# Patient Record
Sex: Male | Born: 1965 | Race: White | Hispanic: No | Marital: Married | Smoking: Current every day smoker
Health system: Southern US, Community
[De-identification: ages and names within clinical notes are randomized; demographics above are authoritative.]

## PROBLEM LIST (undated history)

## (undated) DIAGNOSIS — F329 Major depressive disorder, single episode, unspecified: Secondary | ICD-10-CM

## (undated) DIAGNOSIS — S32409A Unspecified fracture of unspecified acetabulum, initial encounter for closed fracture: Secondary | ICD-10-CM

## (undated) DIAGNOSIS — M1990 Unspecified osteoarthritis, unspecified site: Secondary | ICD-10-CM

## (undated) DIAGNOSIS — F32A Depression, unspecified: Secondary | ICD-10-CM

## (undated) DIAGNOSIS — K219 Gastro-esophageal reflux disease without esophagitis: Secondary | ICD-10-CM

## (undated) HISTORY — PX: JOINT REPLACEMENT: SHX530

## (undated) HISTORY — PX: REVISION OF TOTAL HIP ACETABULUM: SUR1275

---
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
## 2002-02-18 ENCOUNTER — Encounter: Payer: Self-pay | Admitting: *Deleted

## 2002-02-18 ENCOUNTER — Encounter: Admission: RE | Admit: 2002-02-18 | Discharge: 2002-02-18 | Payer: Self-pay | Admitting: *Deleted

## 2005-04-08 ENCOUNTER — Encounter: Admission: RE | Admit: 2005-04-08 | Discharge: 2005-04-08 | Payer: Self-pay | Admitting: Family Medicine

## 2006-09-15 ENCOUNTER — Encounter: Admission: RE | Admit: 2006-09-15 | Discharge: 2006-09-15 | Payer: Self-pay | Admitting: Internal Medicine

## 2009-09-09 ENCOUNTER — Inpatient Hospital Stay (HOSPITAL_COMMUNITY): Admission: RE | Admit: 2009-09-09 | Discharge: 2009-09-12 | Payer: Self-pay | Admitting: Orthopedic Surgery

## 2009-12-15 ENCOUNTER — Emergency Department (HOSPITAL_COMMUNITY): Admission: EM | Admit: 2009-12-15 | Discharge: 2009-12-16 | Payer: Self-pay | Admitting: Emergency Medicine

## 2010-03-21 ENCOUNTER — Encounter: Payer: Self-pay | Admitting: *Deleted

## 2010-05-15 LAB — CBC
HCT: 28.8 % — ABNORMAL LOW (ref 39.0–52.0)
HCT: 30.7 % — ABNORMAL LOW (ref 39.0–52.0)
Hemoglobin: 10.1 g/dL — ABNORMAL LOW (ref 13.0–17.0)
MCH: 31.1 pg (ref 26.0–34.0)
MCH: 31.3 pg (ref 26.0–34.0)
MCV: 88.6 fL (ref 78.0–100.0)
MCV: 88.9 fL (ref 78.0–100.0)
Platelets: 146 10*3/uL — ABNORMAL LOW (ref 150–400)
RBC: 3.24 MIL/uL — ABNORMAL LOW (ref 4.22–5.81)
RBC: 3.47 MIL/uL — ABNORMAL LOW (ref 4.22–5.81)

## 2010-05-15 LAB — BASIC METABOLIC PANEL
CO2: 32 mEq/L (ref 19–32)
Chloride: 99 mEq/L (ref 96–112)
Creatinine, Ser: 1.01 mg/dL (ref 0.4–1.5)
GFR calc Af Amer: >60 mL/min (ref >60)
Potassium: 5 mEq/L (ref 3.5–5.1)

## 2010-05-16 LAB — URINALYSIS, ROUTINE W REFLEX MICROSCOPIC
Hgb urine dipstick: NEGATIVE
Nitrite: NEGATIVE
Specific Gravity, Urine: 1.016 (ref 1.005–1.030)
Urobilinogen, UA: 0.2 mg/dL (ref 0.0–1.0)

## 2010-05-16 LAB — COMPREHENSIVE METABOLIC PANEL
AST: 28 U/L (ref 0–37)
Albumin: 4 g/dL (ref 3.5–5.2)
Alkaline Phosphatase: 38 U/L — ABNORMAL LOW (ref 39–117)
BUN: 10 mg/dL (ref 6–23)
GFR calc Af Amer: >60 mL/min (ref >60)
Potassium: 4.4 mEq/L (ref 3.5–5.1)
Total Protein: 6.7 g/dL (ref 6.0–8.3)

## 2010-05-16 LAB — CBC
HCT: 34.4 % — ABNORMAL LOW (ref 39.0–52.0)
MCH: 31.1 pg (ref 26.0–34.0)
MCHC: 34.7 g/dL (ref 30.0–36.0)
MCV: 89.4 fL (ref 78.0–100.0)
MCV: 89.6 fL (ref 78.0–100.0)
Platelets: 180 10*3/uL (ref 150–400)
RDW: 13.5 % (ref 11.5–15.5)
RDW: 13.5 % (ref 11.5–15.5)
WBC: 6.6 10*3/uL (ref 4.0–10.5)

## 2010-05-16 LAB — PROTIME-INR: INR: 0.99 (ref 0.00–1.49)

## 2010-05-16 LAB — BASIC METABOLIC PANEL
BUN: 8 mg/dL (ref 6–23)
Chloride: 95 mEq/L — ABNORMAL LOW (ref 96–112)
Glucose, Bld: 132 mg/dL — ABNORMAL HIGH (ref 70–99)
Potassium: 4 mEq/L (ref 3.5–5.1)

## 2010-05-16 LAB — SURGICAL PCR SCREEN
MRSA, PCR: NEGATIVE
Staphylococcus aureus: NEGATIVE

## 2010-05-16 LAB — APTT: aPTT: 36 seconds (ref 24–37)

## 2011-09-06 IMAGING — CR DG PORTABLE PELVIS
1 series · 1 of 1 positions shown · non-contrast
Comparison: 08/28/2009

CLINICAL DATA: Left hip arthroplasty

PORTABLE PELVIS

[series 1]
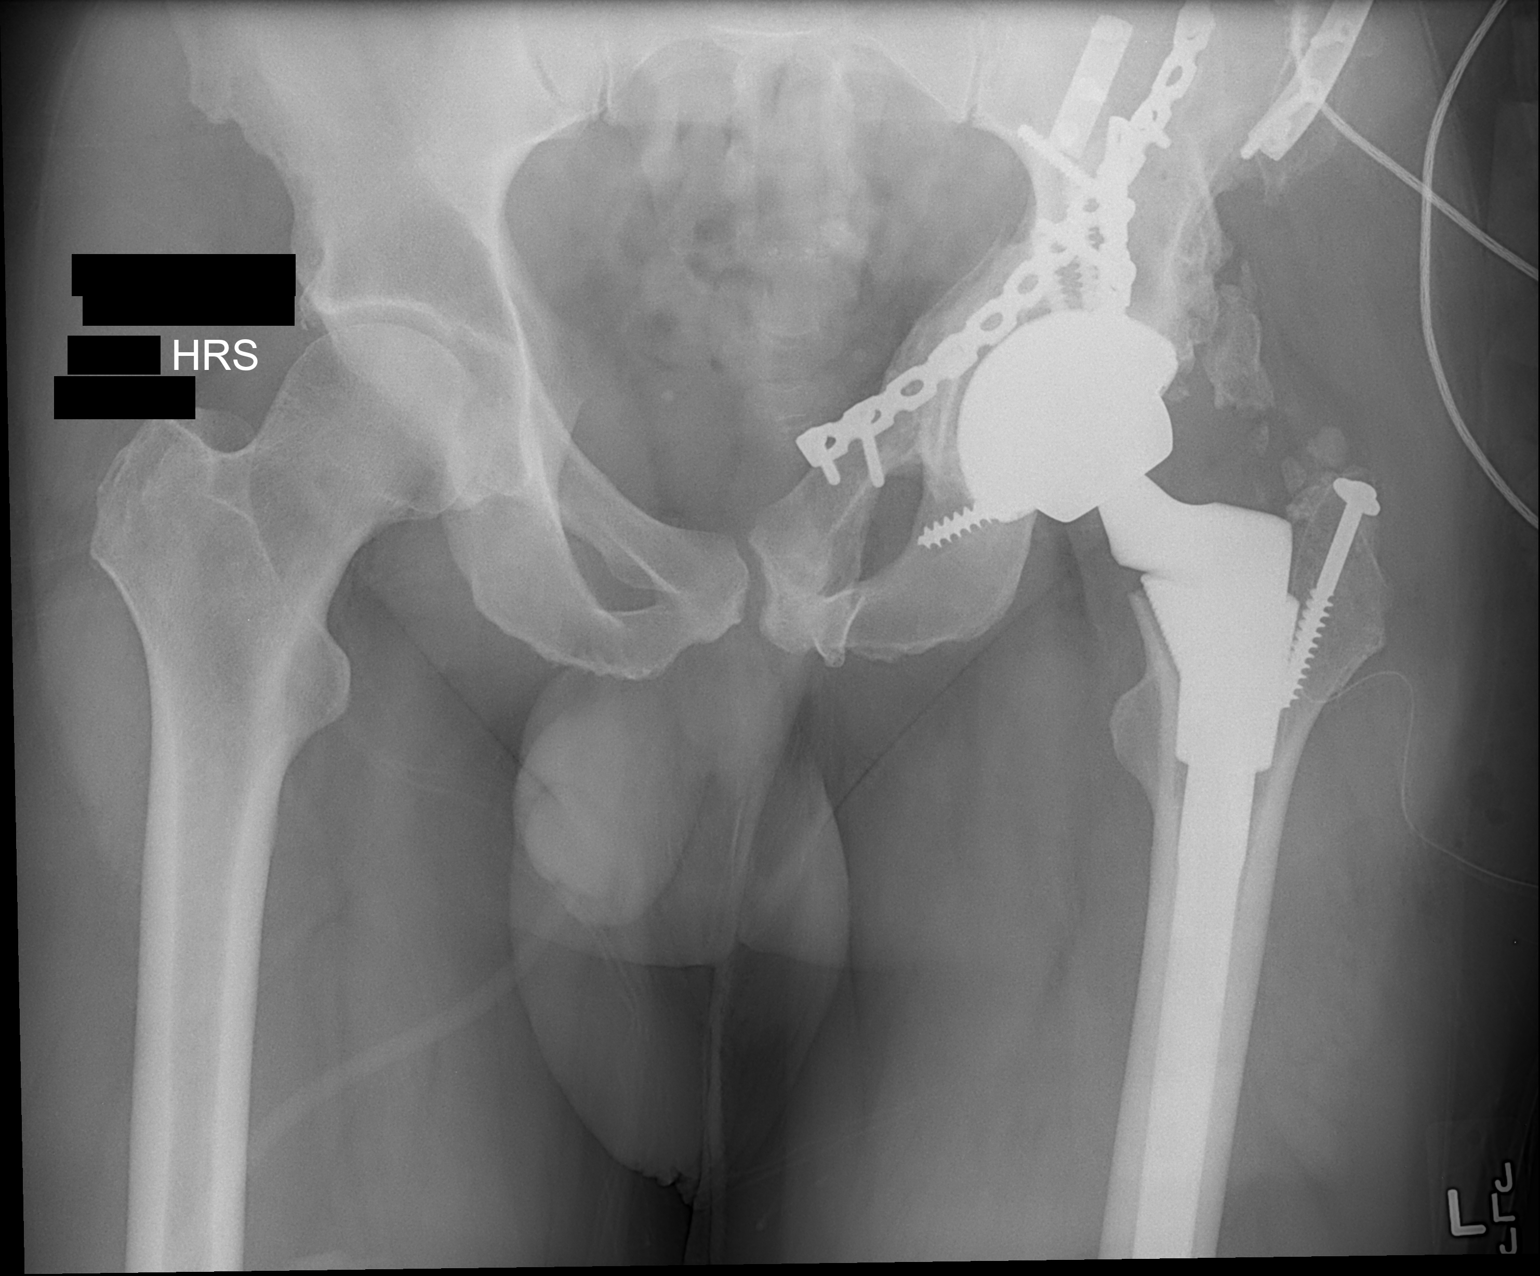

[1 of 1 positions shown; findings below may reference images not displayed]

FINDINGS: Femoral and acetabular components of left hip
arthroplasty project in expected location.  The tip of the femoral
stem is not included on this pelvic radiograph.  Changes of
previous plate and screw fixation of left iliac and    ischial
fractures are stable.  Corticated ossicles project lateral to the
hip as before.
IMPRESSION: 1.  Left hip arthroplasty without apparent complication.

## 2011-09-06 IMAGING — CR DG HIP 1V PORT*L*
1 series · 1 of 1 positions shown · non-contrast
Comparison: 08/28/2009

CLINICAL DATA: PORTABLE LEFT HIP - 1 VIEW

[series 1]
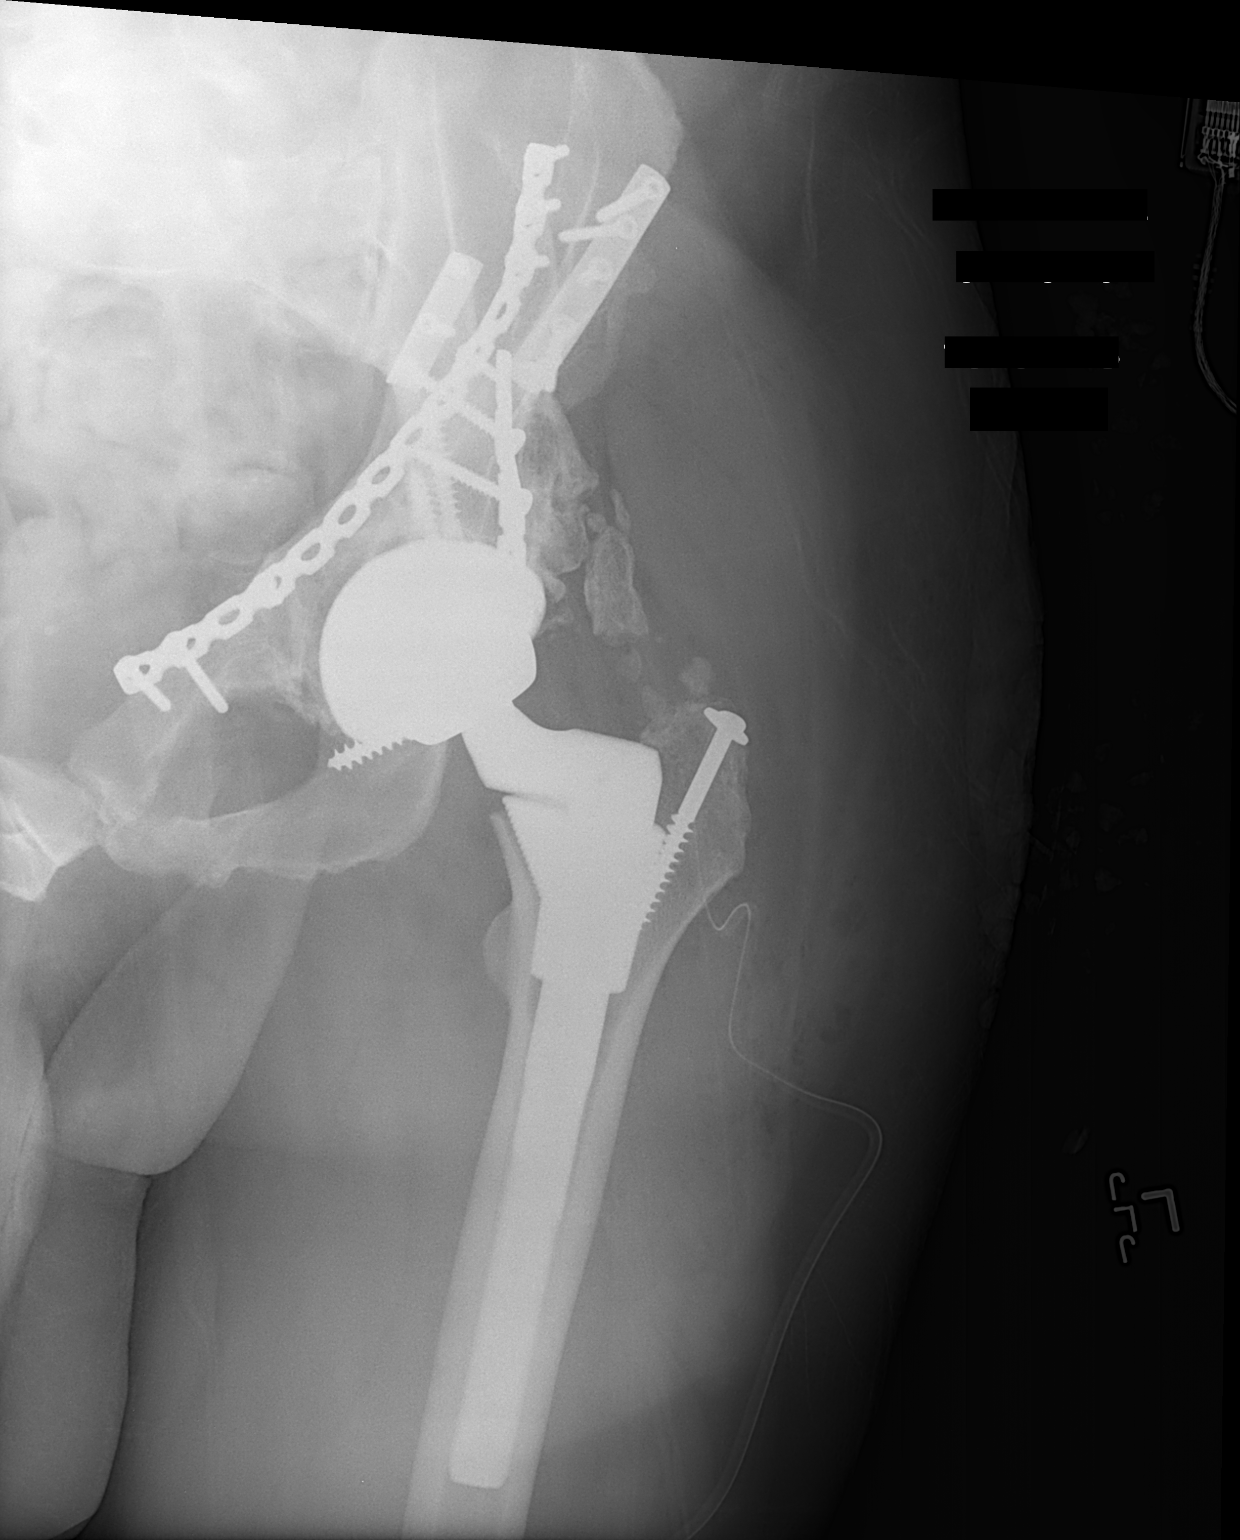

[1 of 1 positions shown; findings below may reference images not displayed]

FINDINGS: Femoral and acetabular components of left hip
arthroplasty project in expected location.   Changes of
previous plate and screw fixation of left iliac and ischial
fractures are stable. Corticated ossicles project lateral to the
hip as before. There is a lateral surgical drain.
IMPRESSION: 1. Left hip arthroplasty without apparent complication.

## 2011-12-18 ENCOUNTER — Emergency Department (INDEPENDENT_AMBULATORY_CARE_PROVIDER_SITE_OTHER)
Admission: EM | Admit: 2011-12-18 | Discharge: 2011-12-18 | Disposition: A | Discharge status: Home / Self Care | Payer: Managed Care, Other (non HMO) | Source: Home / Self Care | Attending: Emergency Medicine | Admitting: Emergency Medicine

## 2011-12-18 ENCOUNTER — Encounter (HOSPITAL_COMMUNITY): Payer: Self-pay | Admitting: Emergency Medicine

## 2011-12-18 DIAGNOSIS — IMO0002 Reserved for concepts with insufficient information to code with codable children: Secondary | ICD-10-CM

## 2011-12-18 DIAGNOSIS — M5416 Radiculopathy, lumbar region: Secondary | ICD-10-CM

## 2011-12-18 HISTORY — DX: Unspecified fracture of unspecified acetabulum, initial encounter for closed fracture: S32.409A

## 2011-12-18 MED ORDER — ONDANSETRON HCL 4 MG/2ML IJ SOLN: 4.0000 mg | Freq: Once | INTRAMUSCULAR | Status: DC

## 2011-12-18 MED ORDER — ONDANSETRON 4 MG PO TBDP
ORAL_TABLET | ORAL | Status: AC
  Filled 2011-12-18: qty 1

## 2011-12-18 MED ORDER — CARISOPRODOL 350 MG PO TABS: 350.0000 mg | ORAL_TABLET | Freq: Three times a day (TID) | ORAL | Status: DC

## 2011-12-18 MED ORDER — METHYLPREDNISOLONE ACETATE 80 MG/ML IJ SUSP
80.0000 mg | Freq: Once | INTRAMUSCULAR | Status: AC
  Administered 2011-12-18: 80 mg via INTRAMUSCULAR

## 2011-12-18 MED ORDER — PREDNISONE 10 MG PO TABS: ORAL_TABLET | ORAL | Status: DC

## 2011-12-18 MED ORDER — ONDANSETRON HCL 4 MG/2ML IJ SOLN
INTRAMUSCULAR | Status: AC
  Filled 2011-12-18: qty 2

## 2011-12-18 MED ORDER — HYDROMORPHONE HCL PF 1 MG/ML IJ SOLN
INTRAMUSCULAR | Status: AC
  Filled 2011-12-18: qty 2

## 2011-12-18 MED ORDER — ONDANSETRON 4 MG PO TBDP
4.0000 mg | ORAL_TABLET | Freq: Once | ORAL | Status: AC
  Administered 2011-12-18: 4 mg via ORAL

## 2011-12-18 MED ORDER — METHYLPREDNISOLONE ACETATE 80 MG/ML IJ SUSP
INTRAMUSCULAR | Status: AC
  Filled 2011-12-18: qty 1

## 2011-12-18 MED ORDER — HYDROMORPHONE HCL PF 1 MG/ML IJ SOLN
2.0000 mg | Freq: Once | INTRAMUSCULAR | Status: AC
  Administered 2011-12-18: 2 mg via INTRAMUSCULAR

## 2011-12-18 MED ORDER — OXYCODONE-ACETAMINOPHEN 5-325 MG PO TABS: ORAL_TABLET | ORAL | Status: DC

## 2011-12-18 NOTE — ED Notes (Signed)
Pain onset one week ago, pain has increased since then.  Patient has had chronic back pain since an injury as a teenager.  Patient does work out frequently. Does not remember injuring self recently.

## 2011-12-18 NOTE — ED Provider Notes (Signed)
Chief Complaint  Patient presents with  . Back Pain    History of Present Illness:   Shawn Romero is a 46 year old male who has had a 2 to three-day history of severe, right lower back pain with radiation down his right leg to the midcalf area. The patient's history dates back to his teenage years when he fractured his acetabulum and had an operative repair this followed by a total hip replacement in recent years. This was on the left side. For the past month he's had some pain in his lower back area and he went first to his orthopedist, Dr. Despina Hick who did a lumbar spine series which showed degenerative disc disease and then to his primary care physician, Dr. Earl Gala. The pain was stable up until about a week ago when he was doing some back exercises and it seemed to get a little bit worse. Over the past 2 or 3 days the pain has been excruciatingly severe, rated 10 over 10 in intensity. It's worse in almost any position and only position of comfort that he can find is lying flat on the floor his back with his legs up on the couch. He denies any numbness, tingling, or weakness in either lower extremity. He has had no bladder or bowel symptoms and no saddle anesthesia he denies any abdominal pain, fever, chills, or unintended weight loss. He can feel a pop in his back when he bends. He had a plain x-ray of his lumbar spine less than a week ago.  Review of Systems:  Other than noted above, the patient denies any of the following symptoms: Systemic:  No fever, chills, severe fatigue, or unexplained weight loss. GI:  No abdominal pain, nausea, vomiting, diarrhea, constipation, incontinence of bowel, or blood in stool. GU:  No dysuria, frequency, urgency, or hematuria. No incontinence of urine or difficulty urinating.  M-S:  No neck pain, joint pain, arthritis, or myalgias. Neuro:  No paresthesias, saddle anesthesia, muscular weakness, or progressive neurological deficit.  PMFSH:  Past medical history, family  history, social history, meds, and allergies were reviewed. Specifically, there is no history of cancer, major trauma, osteoporosis, immunosuppression, HIV, or IV or injection drug use.   Physical Exam:   Vital signs:  BP 140/79  Pulse 77  Temp 97.9 F (36.6 C) (Oral)  Resp 18  SpO2 98% General:  Alert, oriented, in significant distress due to lower back pain. Abdomen:  Soft, non-tender.  No organomegaly or mass.  No pulsatile midline abdominal mass or bruit. Back:  There is pain to palpation in his right lower back just above his iliac crest extending on down to the sacroiliac area and the buttock. The back had almost 0 range of motion with severe pain and spasm in any direction. Straight leg raising was negative bilaterally. I am able to appreciate a popping sensation in his back when he bends. Neuro:  Normal muscle strength, sensations and DTRs were unobtainable for both knees and ankles bilaterally. Extremities: Pedal pulses were full, there was no edema. Skin:  Clear, warm and dry.  No rash.  Course in Urgent Care Center:   He was given Dilaudid 2 mg IM, Zofran 8 mg sublingually, and Depo-Medrol 80 mg IM and tolerated these all well without any immediate side effects.  Assessment:  The encounter diagnosis was Lumbar radiculopathy.  Plan:   1.  The following meds were prescribed:   New Prescriptions   CARISOPRODOL (SOMA) 350 MG TABLET    Take 1 tablet (350  mg total) by mouth 3 (three) times daily.   OXYCODONE-ACETAMINOPHEN (PERCOCET) 5-325 MG PER TABLET    1 to 2 tablets every 6 hours as needed for pain.   PREDNISONE (DELTASONE) 10 MG TABLET    Take 4 tabs daily for 4 days, 3 tabs daily for 4 days, 2 tabs daily for 4 days, then 1 tab daily for 4 days.   2.  The patient was instructed in symptomatic care and handouts were given. I suggested that he followup with either his orthopedic surgeon or with a neurosurgeon he was given the name of Dr. Barnett Abu. 3.  The patient was told to  return if becoming worse in any way, if no better in 2 weeks, and given some red flag symptoms that would indicate earlier return. 4.  The patient was encouraged to try to be as active as possible and given some exercises to do followed by moist heat.    Reuben Likes, MD 12/18/11 [phone]

## 2012-03-16 ENCOUNTER — Other Ambulatory Visit: Payer: Self-pay | Admitting: Neurosurgery

## 2012-03-19 ENCOUNTER — Encounter (HOSPITAL_COMMUNITY): Payer: Self-pay | Admitting: Pharmacy Technician

## 2012-03-23 NOTE — Pre-Procedure Instructions (Addendum)
Jaran Sainz  03/23/2012   Your procedure is scheduled on:  03/28/12  Report to [facility] at830 AM.  Call this number if you have problems the morning of surgery: [phone]   Remember:   Do not eat food or drink liquids after midnight.   Take these medicines the morning of surgery with A SIP OF WATER:prilosec, lamictal, pain med, viibryd, soma  STOP any aspirin , nsaids, herbal meds ,blood thinners   Do not wear jewelry, make-up or nail polish.  Do not wear lotions, powders, or perfumes. You may wear deodorant.  Do not shave 48 hours prior to surgery. Men may shave face and neck.  Do not bring valuables to the hospital.  Contacts, dentures or bridgework may not be worn into surgery.  Leave suitcase in the car. After surgery it may be brought to your room.  For patients admitted to the hospital, checkout time is 11:00 AM the day of  discharge.   Patients discharged the day of surgery will not be allowed to drive  home.  Name and phone number of your driver: laura [phone]  Special Instructions: Shower using CHG 2 nights before surgery and the night before surgery.  If you shower the day of surgery use CHG.  Use special wash - you have one bottle of CHG for all showers.  You should use approximately 1/3 of the bottle for each shower.   Please read over the following fact sheets that you were given: Pain Booklet, Coughing and Deep Breathing, Blood Transfusion Information, MRSA Information and Surgical Site Infection Prevention

## 2012-03-26 ENCOUNTER — Encounter (HOSPITAL_COMMUNITY)
Admission: RE | Admit: 2012-03-26 | Discharge: 2012-03-26 | Disposition: A | Discharge status: Home / Self Care | Payer: Managed Care, Other (non HMO) | Source: Ambulatory Visit | Attending: Neurosurgery | Admitting: Neurosurgery

## 2012-03-26 ENCOUNTER — Encounter (HOSPITAL_COMMUNITY): Payer: Self-pay

## 2012-03-26 HISTORY — DX: Major depressive disorder, single episode, unspecified: F32.9

## 2012-03-26 HISTORY — DX: Depression, unspecified: F32.A

## 2012-03-26 HISTORY — DX: Gastro-esophageal reflux disease without esophagitis: K21.9

## 2012-03-26 HISTORY — DX: Unspecified osteoarthritis, unspecified site: M19.90

## 2012-03-26 LAB — CBC
Hemoglobin: 15.9 g/dL (ref 13.0–17.0)
MCH: 30.5 pg (ref 26.0–34.0)
MCV: 84.5 fL (ref 78.0–100.0)
RBC: 5.21 MIL/uL (ref 4.22–5.81)

## 2012-03-26 LAB — BASIC METABOLIC PANEL
BUN: 14 mg/dL (ref 6–23)
CO2: 24 mEq/L (ref 19–32)
Chloride: 96 mEq/L (ref 96–112)
Creatinine, Ser: 0.89 mg/dL (ref 0.50–1.35)
Glucose, Bld: 109 mg/dL — ABNORMAL HIGH (ref 70–99)

## 2012-03-27 MED ORDER — CEFAZOLIN SODIUM-DEXTROSE 2-3 GM-% IV SOLR: 2.0000 g | INTRAVENOUS | Status: DC

## 2012-03-28 ENCOUNTER — Encounter (HOSPITAL_COMMUNITY): Admission: RE | Payer: Self-pay | Source: Ambulatory Visit

## 2012-03-28 ENCOUNTER — Ambulatory Visit (HOSPITAL_COMMUNITY)
Admission: RE | Admit: 2012-03-28 | Payer: Managed Care, Other (non HMO) | Source: Ambulatory Visit | Admitting: Neurosurgery

## 2012-03-28 SURGERY — POSTERIOR LUMBAR FUSION 1 LEVEL
Anesthesia: General | Site: Back

## 2012-03-29 ENCOUNTER — Other Ambulatory Visit: Payer: Self-pay | Admitting: Neurosurgery

## 2012-03-29 ENCOUNTER — Encounter (HOSPITAL_COMMUNITY): Payer: Self-pay | Admitting: Pharmacy Technician

## 2012-04-01 MED ORDER — CEFAZOLIN SODIUM-DEXTROSE 2-3 GM-% IV SOLR
2.0000 g | INTRAVENOUS | Status: AC
  Administered 2012-04-02 (×2): 2 g via INTRAVENOUS
  Filled 2012-04-01: qty 50

## 2012-04-02 ENCOUNTER — Ambulatory Visit (HOSPITAL_COMMUNITY): Payer: Managed Care, Other (non HMO)

## 2012-04-02 ENCOUNTER — Inpatient Hospital Stay (HOSPITAL_COMMUNITY)
Admission: RE | Admit: 2012-04-02 | Discharge: 2012-04-05 | DRG: 460 | Disposition: A | Discharge status: Home / Self Care | Payer: Managed Care, Other (non HMO) | Source: Ambulatory Visit | Attending: Neurosurgery | Admitting: Neurosurgery

## 2012-04-02 ENCOUNTER — Ambulatory Visit (HOSPITAL_COMMUNITY): Payer: Managed Care, Other (non HMO) | Admitting: Anesthesiology

## 2012-04-02 ENCOUNTER — Encounter (HOSPITAL_COMMUNITY): Payer: Self-pay | Admitting: Anesthesiology

## 2012-04-02 ENCOUNTER — Encounter (HOSPITAL_COMMUNITY): Payer: Self-pay | Admitting: *Deleted

## 2012-04-02 ENCOUNTER — Encounter (HOSPITAL_COMMUNITY)
Admission: RE | Disposition: A | Discharge status: Home / Self Care | Payer: Self-pay | Source: Ambulatory Visit | Attending: Neurosurgery

## 2012-04-02 DIAGNOSIS — Z96649 Presence of unspecified artificial hip joint: Secondary | ICD-10-CM

## 2012-04-02 DIAGNOSIS — F319 Bipolar disorder, unspecified: Secondary | ICD-10-CM | POA: Diagnosis present

## 2012-04-02 DIAGNOSIS — M129 Arthropathy, unspecified: Secondary | ICD-10-CM | POA: Diagnosis present

## 2012-04-02 DIAGNOSIS — Q762 Congenital spondylolisthesis: Principal | ICD-10-CM

## 2012-04-02 DIAGNOSIS — M4316 Spondylolisthesis, lumbar region: Secondary | ICD-10-CM

## 2012-04-02 DIAGNOSIS — K219 Gastro-esophageal reflux disease without esophagitis: Secondary | ICD-10-CM | POA: Diagnosis present

## 2012-04-02 DIAGNOSIS — M48062 Spinal stenosis, lumbar region with neurogenic claudication: Secondary | ICD-10-CM | POA: Diagnosis present

## 2012-04-02 DIAGNOSIS — F411 Generalized anxiety disorder: Secondary | ICD-10-CM | POA: Diagnosis present

## 2012-04-02 DIAGNOSIS — Z79899 Other long term (current) drug therapy: Secondary | ICD-10-CM

## 2012-04-02 DIAGNOSIS — Z01812 Encounter for preprocedural laboratory examination: Secondary | ICD-10-CM

## 2012-04-02 DIAGNOSIS — F17200 Nicotine dependence, unspecified, uncomplicated: Secondary | ICD-10-CM | POA: Diagnosis present

## 2012-04-02 SURGERY — POSTERIOR LUMBAR FUSION 1 LEVEL
Anesthesia: General | Site: Back | Wound class: Clean

## 2012-04-02 MED ORDER — 0.9 % SODIUM CHLORIDE (POUR BTL) OPTIME
TOPICAL | Status: DC | PRN
  Administered 2012-04-02: 1000 mL

## 2012-04-02 MED ORDER — BACITRACIN 50000 UNITS IM SOLR
INTRAMUSCULAR | Status: AC
  Filled 2012-04-02: qty 1

## 2012-04-02 MED ORDER — CALCIUM CHLORIDE 10 % IV SOLN: INTRAVENOUS | Status: DC | PRN

## 2012-04-02 MED ORDER — FENTANYL CITRATE 0.05 MG/ML IJ SOLN
INTRAMUSCULAR | Status: DC | PRN
  Administered 2012-04-02 (×9): 50 ug via INTRAVENOUS
  Administered 2012-04-02: 250 ug via INTRAVENOUS
  Administered 2012-04-02: 50 ug via INTRAVENOUS

## 2012-04-02 MED ORDER — BUPIVACAINE LIPOSOME 1.3 % IJ SUSP
INTRAMUSCULAR | Status: DC | PRN
  Administered 2012-04-02: 20 mL

## 2012-04-02 MED ORDER — PANTOPRAZOLE SODIUM 40 MG PO TBEC
40.0000 mg | DELAYED_RELEASE_TABLET | Freq: Every day | ORAL | Status: DC
  Administered 2012-04-02 – 2012-04-05 (×4): 40 mg via ORAL
  Filled 2012-04-02 (×4): qty 1

## 2012-04-02 MED ORDER — NEOSTIGMINE METHYLSULFATE 1 MG/ML IJ SOLN
INTRAMUSCULAR | Status: DC | PRN
  Administered 2012-04-02: 4 mg via INTRAVENOUS

## 2012-04-02 MED ORDER — EPHEDRINE SULFATE 50 MG/ML IJ SOLN
INTRAMUSCULAR | Status: DC | PRN
  Administered 2012-04-02: 10 mg via INTRAVENOUS
  Administered 2012-04-02: 15 mg via INTRAVENOUS
  Administered 2012-04-02: 10 mg via INTRAVENOUS

## 2012-04-02 MED ORDER — DIPHENHYDRAMINE HCL 12.5 MG/5ML PO ELIX: 12.5000 mg | ORAL_SOLUTION | Freq: Four times a day (QID) | ORAL | Status: DC | PRN

## 2012-04-02 MED ORDER — GLYCOPYRROLATE 0.2 MG/ML IJ SOLN
INTRAMUSCULAR | Status: DC | PRN
  Administered 2012-04-02: 0.6 mg via INTRAVENOUS

## 2012-04-02 MED ORDER — TRAZODONE HCL 150 MG PO TABS
300.0000 mg | ORAL_TABLET | Freq: Every day | ORAL | Status: DC
  Administered 2012-04-02 – 2012-04-04 (×3): 300 mg via ORAL
  Filled 2012-04-02 (×4): qty 2

## 2012-04-02 MED ORDER — ACETAMINOPHEN 325 MG PO TABS
650.0000 mg | ORAL_TABLET | ORAL | Status: DC | PRN
  Administered 2012-04-04: 650 mg via ORAL
  Filled 2012-04-02: qty 2

## 2012-04-02 MED ORDER — CALCIUM CHLORIDE 10 % IV SOLN
INTRAVENOUS | Status: DC | PRN
  Administered 2012-04-02: 100 mg via INTRAVENOUS

## 2012-04-02 MED ORDER — VECURONIUM BROMIDE 10 MG IV SOLR
INTRAVENOUS | Status: DC | PRN
  Administered 2012-04-02 (×3): 1 mg via INTRAVENOUS
  Administered 2012-04-02: 2 mg via INTRAVENOUS
  Administered 2012-04-02: 1 mg via INTRAVENOUS
  Administered 2012-04-02: 2 mg via INTRAVENOUS

## 2012-04-02 MED ORDER — NALOXONE HCL 0.4 MG/ML IJ SOLN: 0.4000 mg | INTRAMUSCULAR | Status: DC | PRN

## 2012-04-02 MED ORDER — LIDOCAINE HCL (CARDIAC) 20 MG/ML IV SOLN
INTRAVENOUS | Status: DC | PRN
  Administered 2012-04-02: 100 mg via INTRAVENOUS

## 2012-04-02 MED ORDER — MENTHOL 3 MG MT LOZG: 1.0000 | LOZENGE | OROMUCOSAL | Status: DC | PRN

## 2012-04-02 MED ORDER — ONDANSETRON HCL 4 MG/2ML IJ SOLN: 4.0000 mg | Freq: Four times a day (QID) | INTRAMUSCULAR | Status: DC | PRN

## 2012-04-02 MED ORDER — LACTATED RINGERS IV SOLN
INTRAVENOUS | Status: DC
  Administered 2012-04-02 – 2012-04-03 (×2): via INTRAVENOUS

## 2012-04-02 MED ORDER — MORPHINE SULFATE (PF) 1 MG/ML IV SOLN
INTRAVENOUS | Status: AC
  Administered 2012-04-02: 16:00:00
  Filled 2012-04-02: qty 25

## 2012-04-02 MED ORDER — DOCUSATE SODIUM 100 MG PO CAPS
100.0000 mg | ORAL_CAPSULE | Freq: Two times a day (BID) | ORAL | Status: DC
  Administered 2012-04-02 – 2012-04-05 (×6): 100 mg via ORAL
  Filled 2012-04-02 (×6): qty 1

## 2012-04-02 MED ORDER — LAMOTRIGINE 100 MG PO TABS
100.0000 mg | ORAL_TABLET | Freq: Every day | ORAL | Status: DC
  Administered 2012-04-03 – 2012-04-05 (×3): 100 mg via ORAL
  Filled 2012-04-02 (×3): qty 1

## 2012-04-02 MED ORDER — SODIUM CHLORIDE 0.9 % IV SOLN
INTRAVENOUS | Status: AC
  Filled 2012-04-02: qty 500

## 2012-04-02 MED ORDER — CEFAZOLIN SODIUM-DEXTROSE 2-3 GM-% IV SOLR
INTRAVENOUS | Status: AC
  Filled 2012-04-02: qty 50

## 2012-04-02 MED ORDER — DIAZEPAM 5 MG PO TABS
ORAL_TABLET | ORAL | Status: AC
  Filled 2012-04-02: qty 1

## 2012-04-02 MED ORDER — HYDROMORPHONE HCL PF 1 MG/ML IJ SOLN
INTRAMUSCULAR | Status: AC
  Filled 2012-04-02: qty 1

## 2012-04-02 MED ORDER — LIDOCAINE HCL 4 % MT SOLN
OROMUCOSAL | Status: DC | PRN
  Administered 2012-04-02: 4 mL via TOPICAL

## 2012-04-02 MED ORDER — HYDROMORPHONE HCL PF 1 MG/ML IJ SOLN
0.2500 mg | INTRAMUSCULAR | Status: DC | PRN
  Administered 2012-04-02 (×4): 0.5 mg via INTRAVENOUS

## 2012-04-02 MED ORDER — HYDROMORPHONE HCL PF 1 MG/ML IJ SOLN: 0.2500 mg | INTRAMUSCULAR | Status: DC | PRN

## 2012-04-02 MED ORDER — BUPIVACAINE-EPINEPHRINE PF 0.5-1:200000 % IJ SOLN
INTRAMUSCULAR | Status: DC | PRN
  Administered 2012-04-02: 10 mL

## 2012-04-02 MED ORDER — ROCURONIUM BROMIDE 100 MG/10ML IV SOLN
INTRAVENOUS | Status: DC | PRN
  Administered 2012-04-02: 10 mg via INTRAVENOUS
  Administered 2012-04-02: 50 mg via INTRAVENOUS
  Administered 2012-04-02: 30 mg via INTRAVENOUS
  Administered 2012-04-02: 10 mg via INTRAVENOUS

## 2012-04-02 MED ORDER — SODIUM CHLORIDE 0.9 % IR SOLN
Status: DC | PRN
  Administered 2012-04-02: 11:00:00

## 2012-04-02 MED ORDER — ACETAMINOPHEN 650 MG RE SUPP: 650.0000 mg | RECTAL | Status: DC | PRN

## 2012-04-02 MED ORDER — DIAZEPAM 5 MG PO TABS
5.0000 mg | ORAL_TABLET | Freq: Four times a day (QID) | ORAL | Status: DC | PRN
  Administered 2012-04-02 – 2012-04-05 (×11): 5 mg via ORAL
  Filled 2012-04-02 (×10): qty 1

## 2012-04-02 MED ORDER — BACITRACIN ZINC 500 UNIT/GM EX OINT
TOPICAL_OINTMENT | CUTANEOUS | Status: DC | PRN
  Administered 2012-04-02: 1 via TOPICAL

## 2012-04-02 MED ORDER — HYDROCODONE-ACETAMINOPHEN 5-325 MG PO TABS
1.0000 | ORAL_TABLET | ORAL | Status: DC | PRN
  Administered 2012-04-03 – 2012-04-05 (×7): 2 via ORAL
  Filled 2012-04-02 (×7): qty 2

## 2012-04-02 MED ORDER — PROPOFOL 10 MG/ML IV BOLUS
INTRAVENOUS | Status: DC | PRN
  Administered 2012-04-02: 400 mg via INTRAVENOUS

## 2012-04-02 MED ORDER — ONDANSETRON HCL 4 MG/2ML IJ SOLN: 4.0000 mg | Freq: Once | INTRAMUSCULAR | Status: DC | PRN

## 2012-04-02 MED ORDER — PHENOL 1.4 % MT LIQD: 1.0000 | OROMUCOSAL | Status: DC | PRN

## 2012-04-02 MED ORDER — ZOLPIDEM TARTRATE 5 MG PO TABS
5.0000 mg | ORAL_TABLET | Freq: Every evening | ORAL | Status: DC | PRN
  Administered 2012-04-02 – 2012-04-04 (×3): 5 mg via ORAL
  Filled 2012-04-02 (×3): qty 1

## 2012-04-02 MED ORDER — CEFAZOLIN SODIUM-DEXTROSE 2-3 GM-% IV SOLR
2.0000 g | Freq: Three times a day (TID) | INTRAVENOUS | Status: AC
  Administered 2012-04-02 – 2012-04-03 (×2): 2 g via INTRAVENOUS
  Filled 2012-04-02 (×2): qty 50

## 2012-04-02 MED ORDER — MORPHINE SULFATE (PF) 1 MG/ML IV SOLN
INTRAVENOUS | Status: DC
  Administered 2012-04-02: 21:00:00 via INTRAVENOUS
  Filled 2012-04-02 (×2): qty 25

## 2012-04-02 MED ORDER — ONDANSETRON HCL 4 MG/2ML IJ SOLN: 4.0000 mg | INTRAMUSCULAR | Status: DC | PRN

## 2012-04-02 MED ORDER — MIDAZOLAM HCL 5 MG/5ML IJ SOLN
INTRAMUSCULAR | Status: DC | PRN
  Administered 2012-04-02: 2 mg via INTRAVENOUS

## 2012-04-02 MED ORDER — SODIUM CHLORIDE 0.9 % IJ SOLN: 9.0000 mL | INTRAMUSCULAR | Status: DC | PRN

## 2012-04-02 MED ORDER — ALBUMIN HUMAN 5 % IV SOLN
INTRAVENOUS | Status: DC | PRN
  Administered 2012-04-02: 14:00:00 via INTRAVENOUS

## 2012-04-02 MED ORDER — VILAZODONE HCL 20 MG PO TABS
1.0000 | ORAL_TABLET | Freq: Every day | ORAL | Status: DC
  Administered 2012-04-03 – 2012-04-05 (×3): 20 mg via ORAL
  Filled 2012-04-02 (×3): qty 1

## 2012-04-02 MED ORDER — HYDROMORPHONE HCL PF 1 MG/ML IJ SOLN
0.5000 mg | INTRAMUSCULAR | Status: DC | PRN
  Administered 2012-04-02 – 2012-04-03 (×5): 0.5 mg via INTRAVENOUS
  Filled 2012-04-02 (×5): qty 1

## 2012-04-02 MED ORDER — LACTATED RINGERS IV SOLN
INTRAVENOUS | Status: DC | PRN
  Administered 2012-04-02 (×4): via INTRAVENOUS

## 2012-04-02 MED ORDER — ONDANSETRON HCL 4 MG/2ML IJ SOLN
INTRAMUSCULAR | Status: DC | PRN
  Administered 2012-04-02: 4 mg via INTRAVENOUS

## 2012-04-02 MED ORDER — BUPIVACAINE LIPOSOME 1.3 % IJ SUSP
20.0000 mL | Freq: Once | INTRAMUSCULAR | Status: DC
  Filled 2012-04-02: qty 20

## 2012-04-02 MED ORDER — HYDROMORPHONE HCL 2 MG PO TABS
4.0000 mg | ORAL_TABLET | ORAL | Status: DC | PRN
  Administered 2012-04-02 – 2012-04-05 (×10): 4 mg via ORAL
  Filled 2012-04-02 (×10): qty 2

## 2012-04-02 MED ORDER — DIPHENHYDRAMINE HCL 50 MG/ML IJ SOLN: 12.5000 mg | Freq: Four times a day (QID) | INTRAMUSCULAR | Status: DC | PRN

## 2012-04-02 MED ORDER — THROMBIN 20000 UNITS EX SOLR
CUTANEOUS | Status: DC | PRN
  Administered 2012-04-02: 11:00:00 via TOPICAL

## 2012-04-02 MED ORDER — HYDROMORPHONE HCL PF 1 MG/ML IJ SOLN
0.2500 mg | INTRAMUSCULAR | Status: DC | PRN
  Administered 2012-04-02 (×5): 0.5 mg via INTRAVENOUS

## 2012-04-02 SURGICAL SUPPLY — 67 items
BAG DECANTER FOR FLEXI CONT (MISCELLANEOUS) ×2 IMPLANT
BENZOIN TINCTURE PRP APPL 2/3 (GAUZE/BANDAGES/DRESSINGS) ×2 IMPLANT
BLADE SURG 15 STRL LF DISP TIS (BLADE) ×1 IMPLANT
BLADE SURG 15 STRL SS (BLADE) ×2
BLADE SURG ROTATE 9660 (MISCELLANEOUS) ×2 IMPLANT
BRUSH SCRUB EZ PLAIN DRY (MISCELLANEOUS) ×2 IMPLANT
BUR ACORN 6.0 (BURR) ×2 IMPLANT
BUR MATCHSTICK NEURO 3.0 LAGG (BURR) ×4 IMPLANT
CANISTER SUCTION 2500CC (MISCELLANEOUS) ×2 IMPLANT
CAP REVERE LOCKING (Cap) ×8 IMPLANT
CLOTH BEACON ORANGE TIMEOUT ST (SAFETY) ×2 IMPLANT
CONT SPEC 4OZ CLIKSEAL STRL BL (MISCELLANEOUS) ×2 IMPLANT
COVER BACK TABLE 24X17X13 BIG (DRAPES) IMPLANT
COVER TABLE BACK 60X90 (DRAPES) ×2 IMPLANT
DRAPE C-ARM 42X72 X-RAY (DRAPES) ×8 IMPLANT
DRAPE LAPAROTOMY 100X72X124 (DRAPES) ×2 IMPLANT
DRAPE POUCH INSTRU U-SHP 10X18 (DRAPES) ×2 IMPLANT
DRAPE PROXIMA HALF (DRAPES) ×8 IMPLANT
DRAPE SURG 17X23 STRL (DRAPES) ×8 IMPLANT
ELECT BLADE 4.0 EZ CLEAN MEGAD (MISCELLANEOUS) ×2
ELECT REM PT RETURN 9FT ADLT (ELECTROSURGICAL) ×2
ELECTRODE BLDE 4.0 EZ CLN MEGD (MISCELLANEOUS) ×1 IMPLANT
ELECTRODE REM PT RTRN 9FT ADLT (ELECTROSURGICAL) ×1 IMPLANT
EVACUATOR 1/8 PVC DRAIN (DRAIN) ×2 IMPLANT
GAUZE SPONGE 4X4 16PLY XRAY LF (GAUZE/BANDAGES/DRESSINGS) IMPLANT
GLOVE BIO SURGEON STRL SZ8.5 (GLOVE) ×4 IMPLANT
GLOVE EXAM NITRILE LRG STRL (GLOVE) IMPLANT
GLOVE EXAM NITRILE MD LF STRL (GLOVE) IMPLANT
GLOVE EXAM NITRILE XL STR (GLOVE) IMPLANT
GLOVE EXAM NITRILE XS STR PU (GLOVE) IMPLANT
GLOVE INDICATOR 7.0 STRL GRN (GLOVE) ×6 IMPLANT
GLOVE SS BIOGEL STRL SZ 6.5 (GLOVE) ×2 IMPLANT
GLOVE SS BIOGEL STRL SZ 8 (GLOVE) ×2 IMPLANT
GLOVE SUPERSENSE BIOGEL SZ 6.5 (GLOVE) ×2
GLOVE SUPERSENSE BIOGEL SZ 8 (GLOVE) ×2
GOWN BRE IMP SLV AUR LG STRL (GOWN DISPOSABLE) ×4 IMPLANT
GOWN BRE IMP SLV AUR XL STRL (GOWN DISPOSABLE) ×4 IMPLANT
GOWN STRL REIN 2XL LVL4 (GOWN DISPOSABLE) IMPLANT
GRANULES ACTIFUSE 10ML (Putty) ×2 IMPLANT
KIT BASIN OR (CUSTOM PROCEDURE TRAY) ×2 IMPLANT
KIT ROOM TURNOVER OR (KITS) ×2 IMPLANT
NEEDLE HYPO 21X1.5 SAFETY (NEEDLE) ×2 IMPLANT
NEEDLE HYPO 22GX1.5 SAFETY (NEEDLE) ×2 IMPLANT
NS IRRIG 1000ML POUR BTL (IV SOLUTION) ×2 IMPLANT
PACK FOAM VITOSS 10CC (Orthopedic Implant) ×2 IMPLANT
PACK LAMINECTOMY NEURO (CUSTOM PROCEDURE TRAY) ×2 IMPLANT
PAD ARMBOARD 7.5X6 YLW CONV (MISCELLANEOUS) ×6 IMPLANT
PATTIES SURGICAL .5 X1 (DISPOSABLE) IMPLANT
ROD REVERE 6.35 40MM (Rod) ×4 IMPLANT
SCREW REVERE 6.35 75X55MM (Screw) ×8 IMPLANT
SLEEVE SURGEON STRL (DRAPES) ×2 IMPLANT
SPACER SUSTAIN O 10X26 13MM (Spacer) ×4 IMPLANT
SPONGE GAUZE 4X4 12PLY (GAUZE/BANDAGES/DRESSINGS) ×2 IMPLANT
SPONGE LAP 4X18 X RAY DECT (DISPOSABLE) IMPLANT
SPONGE NEURO XRAY DETECT 1X3 (DISPOSABLE) IMPLANT
SPONGE SURGIFOAM ABS GEL 100 (HEMOSTASIS) ×2 IMPLANT
STRIP CLOSURE SKIN 1/2X4 (GAUZE/BANDAGES/DRESSINGS) ×2 IMPLANT
SUT VIC AB 1 CT1 18XBRD ANBCTR (SUTURE) ×2 IMPLANT
SUT VIC AB 1 CT1 8-18 (SUTURE) ×2
SUT VIC AB 2-0 CP2 18 (SUTURE) ×4 IMPLANT
SYR 20CC LL (SYRINGE) ×2 IMPLANT
SYR 20ML ECCENTRIC (SYRINGE) ×4 IMPLANT
TAPE CLOTH SURG 4X10 WHT LF (GAUZE/BANDAGES/DRESSINGS) ×2 IMPLANT
TOWEL OR 17X24 6PK STRL BLUE (TOWEL DISPOSABLE) ×2 IMPLANT
TOWEL OR 17X26 10 PK STRL BLUE (TOWEL DISPOSABLE) ×2 IMPLANT
TRAY FOLEY CATH 14FRSI W/METER (CATHETERS) ×2 IMPLANT
WATER STERILE IRR 1000ML POUR (IV SOLUTION) ×2 IMPLANT

## 2012-04-02 NOTE — Anesthesia Preprocedure Evaluation (Addendum)
Anesthesia Evaluation  Patient identified by MRN, date of birth, ID band Patient awake    Reviewed: Allergy & Precautions, H&P , NPO status , Patient's Chart, lab work & pertinent test results  Airway Mallampati: II TM Distance: >3 FB Neck ROM: full    Dental  (+) Dental Advisory Given   Pulmonary Current Smoker,          Cardiovascular Rhythm:regular Rate:Normal     Neuro/Psych Anxiety Bipolar Disorder    GI/Hepatic GERD-  ,  Endo/Other    Renal/GU      Musculoskeletal  (+) Arthritis -,   Abdominal   Peds  Hematology   Anesthesia Other Findings   Reproductive/Obstetrics                          Anesthesia Physical Anesthesia Plan  ASA: II  Anesthesia Plan: General   Post-op Pain Management:    Induction: Intravenous  Airway Management Planned: Oral ETT  Additional Equipment:   Intra-op Plan:   Post-operative Plan: Extubation in OR  Informed Consent: I have reviewed the patients History and Physical, chart, labs and discussed the procedure including the risks, benefits and alternatives for the proposed anesthesia with the patient or authorized representative who has indicated his/her understanding and acceptance.   Dental advisory given  Plan Discussed with: CRNA, Anesthesiologist and Surgeon  Anesthesia Plan Comments:        Anesthesia Quick Evaluation

## 2012-04-02 NOTE — Progress Notes (Signed)
Patient received at 1735 from neuro PACU. Patient alert and oriented PCA morphine noted with lactated ringers infusing at 75cc/hour . o2 sat at 95% on 2l/min [place] . Back dressing noted with drainage shadowed to proximal area of dressing . Foley catheter intact . Oriented patient to room and call bell system . reviewed safety protocol with patient . Continue with plan of care .                               Shawn Romero

## 2012-04-02 NOTE — H&P (Signed)
Subjective: The patient is a 47 year old white male who has suffered from chronic back pain. Lately it has increased and he has bilateral buttock pain. He has failed medical management and was worked up with a lumbar MRI. This demonstrated the patient had a spondylolisthesis, facet arthropathy, spinal stenosis, etc. at L4-5. I discussed the situation with the patient. We discussed the various treatment options including surgery He has weighed the risks, benefits, and alternatives surgery and decided proceed with surgery.   Past Medical History  Diagnosis Date  . Acetabular fracture   . Depression   . GERD (gastroesophageal reflux disease)   . Arthritis     Past Surgical History  Procedure Date  . Revision of total hip acetabulum   . Joint replacement     total hip left 12  . Hernia repair 88    ing lft     No Known Allergies  History  Substance Use Topics  . Smoking status: Current Every Day Smoker -- 1.0 packs/day for 10 years    Types: Cigarettes  . Smokeless tobacco: Not on file  . Alcohol Use: No    History reviewed. No pertinent family history. Prior to Admission medications   Medication Sig Start Date End Date Taking? Authorizing Provider  carisoprodol (SOMA) 350 MG tablet Take 350 mg by mouth 3 (three) times daily as needed. For pain   Yes Historical Provider, MD  HYDROmorphone (DILAUDID) 4 MG tablet Take 4 mg by mouth every 6 (six) hours as needed. For pain   Yes Historical Provider, MD  lamoTRIgine (LAMICTAL) 100 MG tablet Take 100 mg by mouth daily.   Yes Historical Provider, MD  omeprazole (PRILOSEC) 20 MG capsule Take 20 mg by mouth daily.   Yes Historical Provider, MD  traZODone (DESYREL) 150 MG tablet Take 300 mg by mouth at bedtime.   Yes Historical Provider, MD  Vilazodone HCl (VIIBRYD) 20 MG TABS Take 1 tablet by mouth daily.   Yes Historical Provider, MD     Review of Systems  Positive ROS: As above  All other systems have been reviewed and were otherwise  negative with the exception of those mentioned in the HPI and as above.  Objective: Vital signs in last 24 hours: Temp:  [97.9 F (36.6 C)] 97.9 F (36.6 C) (02/03 0918) Pulse Rate:  [94] 94  (02/03 0918) Resp:  [20] 20  (02/03 0918) BP: (144)/(90) 144/90 mmHg (02/03 0918) SpO2:  [94 %] 94 % (02/03 0918)  General Appearance: Alert, cooperative, no distress, appears stated age Head: Normocephalic, without obvious abnormality, atraumatic Eyes: PERRL, conjunctiva/corneas clear, EOM's intact, fundi benign, both eyes      Ears: Normal TM's and external ear canals, both ears Throat: Lips, mucosa, and tongue normal; teeth and gums normal Neck: Supple, symmetrical, trachea midline, no adenopathy; thyroid: No enlargement/tenderness/nodules; no carotid bruit or JVD Back: Symmetric, no curvature, ROM normal, no CVA tenderness Lungs: Clear to auscultation bilaterally, respirations unlabored Heart: Regular rate and rhythm, S1 and S2 normal, no murmur, rub or gallop Abdomen: Soft, non-tender, bowel sounds active all four quadrants, no masses, no organomegaly Extremities: Extremities normal, atraumatic, no cyanosis or edema Pulses: 2+ and symmetric all extremities Skin: Skin color, texture, turgor normal, no rashes or lesions  NEUROLOGIC:   Mental status: alert and oriented, no aphasia, good attention span, Fund of knowledge/ memory ok Motor Exam - grossly normal Sensory Exam - grossly normal Reflexes:  Coordination - grossly normal Gait - grossly normal Balance - grossly normal  Cranial Nerves: I: smell Not tested  II: visual acuity  OS: Normal    OD: Normal   II: visual fields Full to confrontation  II: pupils Equal, round, reactive to light  III,VII: ptosis None  III,IV,VI: extraocular muscles  Full ROM  V: mastication Normal  V: facial light touch sensation  Normal  V,VII: corneal reflex  Present  VII: facial muscle function - upper  Normal  VII: facial muscle function - lower  Normal  VIII: hearing Not tested  IX: soft palate elevation  Normal  IX,X: gag reflex Present  XI: trapezius strength  5/5  XI: sternocleidomastoid strength 5/5  XI: neck flexion strength  5/5  XII: tongue strength  Normal    Data Review Lab Results  Component Value Date   WBC 10.1 03/26/2012   HGB 15.9 03/26/2012   HCT 44.0 03/26/2012   MCV 84.5 03/26/2012   PLT 253 03/26/2012   Lab Results  Component Value Date   NA 135 03/26/2012   K 4.2 03/26/2012   CL 96 03/26/2012   CO2 24 03/26/2012   BUN 14 03/26/2012   CREATININE 0.89 03/26/2012   GLUCOSE 109* 03/26/2012   Lab Results  Component Value Date   INR 1.42 09/12/2009    Assessment/Plan: L4-5 spondylolisthesis, facet arthropathy, spinal stenosis, lumbago, lumbar radiculopathy: I discussed the situation with the patient. I reviewed his MR scan with them and pointed out the abnormalities. We have discussed the various treatment options including surgery. I have described the surgical option of an L4-5 decompression, instrumentation, and fusion. I described the surgery to him. I've shown him surgical models. We have discussed the risks, benefits, alternatives, and likelihood of achieving our goals with surgery. I have answered all the patient's questions. He wants to proceed with surgery.   Yamaira Spinner D 04/02/2012 10:01 AM

## 2012-04-02 NOTE — Transfer of Care (Signed)
Immediate Anesthesia Transfer of Care Note  Patient: Shawn Romero  Procedure(s) Performed: Procedure(s) (LRB) with comments: POSTERIOR LUMBAR FUSION 1 LEVEL (N/A) - Lumbar four-five laminectomy with posterior lumbar interbody fusion with interbody prothesis posterolateral arthrodesis and posterior nonsegmental instrumentation  Patient Location: PACU  Anesthesia Type:General  Level of Consciousness: awake, alert  and oriented  Airway & Oxygen Therapy: Patient Spontanous Breathing and Patient connected to face mask oxygen  Post-op Assessment: Report given to PACU RN, Post -op Vital signs reviewed and stable and Patient moving all extremities X 4  Post vital signs: Reviewed and stable  Complications: No apparent anesthesia complications

## 2012-04-02 NOTE — Anesthesia Postprocedure Evaluation (Signed)
  Anesthesia Post-op Note  Patient: Shawn Romero  Procedure(s) Performed: Procedure(s) (LRB) with comments: POSTERIOR LUMBAR FUSION 1 LEVEL (N/A) - Lumbar four-five laminectomy with posterior lumbar interbody fusion with interbody prothesis posterolateral arthrodesis and posterior nonsegmental instrumentation  Patient Location: PACU  Anesthesia Type:General  Level of Consciousness: awake, alert , oriented and patient cooperative  Airway and Oxygen Therapy: Patient Spontanous Breathing  Post-op Pain: mild  Post-op Assessment: Post-op Vital signs reviewed, Patient's Cardiovascular Status Stable, Respiratory Function Stable, Patent Airway, No signs of Nausea or vomiting and Pain level controlled  Post-op Vital Signs: stable  Complications: No apparent anesthesia complications

## 2012-04-02 NOTE — Op Note (Signed)
Brief history: The patient is a 47 year old white male who has suffered from chronic back and buttock pain. He has failed medical management and was worked up with a lumbar MRI and lumbar x-rays. These demonstrated a spondylolisthesis at L4-5 with spinal stenosis and facet arthropathy. I discussed the various treatment options with the patient including surgery. The patient has weighed the risks, benefits, and alternatives surgery and decided proceed with a L4-5 decompression, instrumentation, and fusion.  Preoperative diagnosis: L4-5 spondylolisthesis , spinal stenosis compressing both the L4 and the L5 nerve roots; lumbago; lumbar radiculopathy  Postoperative diagnosis: The same  Procedure: Bilateral L4 Laminotomy/foraminotomies to decompress the bilateral L4 and L5 nerve roots(the work required to do this was in addition to the work required to do the posterior lumbar interbody fusion because of the patient's spinal stenosis, facet arthropathy. Etc. requiring a wide decompression of the nerve roots.); L4-5 posterior lumbar interbody fusion with local morselized autograft bone and Actifusebone graft extender; insertion of interbody prosthesis at L4-5 (globus peek interbody prosthesis); posterior nonsegmental instrumentation from L4 to L5 with globus titanium pedicle screws and rods; posterior lateral arthrodesis at L4-5 with local morselized autograft bone and Vitoss bone graft extender.  Surgeon: Dr. Delma Officer  Asst.: Dr. Jillyn Hidden cram  Anesthesia: Gen. endotracheal  Estimated blood loss: 300 cc  Drains: None  Locations: None  Description of procedure: The patient was brought to the operating room by the anesthesia team. General endotracheal anesthesia was induced. The patient was turned to the prone position on the Wilson frame. The patient's lumbosacral region was then prepared with Betadine scrub and Betadine solution. Sterile drapes were applied.  I then injected the area to be incised  with Marcaine with epinephrine solution. I then used the scalpel to make a linear midline incision over the L4-5 interspace. I then used electrocautery to perform a bilateral subperiosteal dissection exposing the spinous process and lamina of L4 and L5. We then obtained intraoperative radiograph to confirm our location. We then inserted the Verstrac retractor to provide exposure.  I began the decompression by using the high speed drill to perform laminotomies at L4. We then used the Kerrison punches to widen the laminotomy and removed the ligamentum flavum at L4-5. We used the Kerrison punches to remove the medial facets at L4-5. We performed wide foraminotomies about the bilateral L4 and L5 nerve roots completing the decompression.  We now turned our attention to the posterior lumbar interbody fusion. I used a scalpel to incise the intervertebral disc at L4-5. I then performed a partial intervertebral discectomy at L4-5 using the pituitary forceps. We prepared the vertebral endplates at L4-5 for the fusion by removing the soft tissues with the curettes. We then used the trial spacers to pick the appropriate sized interbody prosthesis. We prefilled his prosthesis with a combination of local morselized autograft bone that we obtained during the decompression as well as Actifuse bone graft extender. We inserted the prefilled prosthesis into the interspace at L4-5. There was a good snug fit of the prosthesis in the interspace. We then filled and the remainder of the intervertebral disc space with local morselized autograft bone and Actifuse. This completed the posterior lumbar interbody arthrodesis.  We now turned attention to the instrumentation. Under fluoroscopic guidance we cannulated the bilateral L4 and L5 pedicles with the bone probe. We then removed the bone probe. We then tapped the pedicle with a 6.5 millimeter tap. We then removed the tap. We probed inside the tapped pedicle with a  ball probe to rule  out cortical breaches. We then inserted a 7.5 x 55 millimeter pedicle screw into the L4 and L5 pedicles bilaterally under fluoroscopic guidance. We then palpated along the medial aspect of the pedicles to rule out cortical breaches. There were none. The nerve roots were not injured. We then connected the unilateral pedicle screws with a lordotic rod. We compressed the construct and secured the rod in place with the caps. We then tightened the caps appropriately. This completed the instrumentation from L4-5.  We now turned our attention to the posterior lateral arthrodesis at L4-5. We used the high-speed drill to decorticate the remainder of the facets, pars, transverse process at L4-5. We then applied a combination of local morselized autograft bone and Vitoss bone graft extender over these decorticated posterior lateral structures. This completed the posterior lateral arthrodesis.  We then obtained hemostasis using bipolar electrocautery. We irrigated the wound out with bacitracin solution. We inspected the thecal sac and nerve roots and noted they were well decompressed. We then removed the retractor. We reapproximated patient's thoracolumbar fascia with interrupted #1 Vicryl suture. We reapproximated patient's subcutaneous tissue with interrupted 2-0 Vicryl suture. The reapproximated patient's skin with Steri-Strips and benzoin. The wound was then coated with bacitracin ointment. A sterile dressing was applied. The drapes were removed. The patient was subsequently returned to the supine position where they were extubated by the anesthesia team. He was then transported to the post anesthesia care unit in stable condition. All sponge instrument and needle counts were reportedly correct at the end of this case.

## 2012-04-02 NOTE — Progress Notes (Signed)
Pt. States still in pain, able to laugh and converse, has to be reminded to deep breath

## 2012-04-02 NOTE — Progress Notes (Signed)
Pt. States pain isnt getting any better, DR.Smith aware and additional DIlaudid given

## 2012-04-02 NOTE — Preoperative (Signed)
Beta Blockers   Reason not to administer Beta Blockers:Not Applicable 

## 2012-04-03 LAB — CBC
Hemoglobin: 13.4 g/dL (ref 13.0–17.0)
Platelets: 206 10*3/uL (ref 150–400)
RBC: 4.39 MIL/uL (ref 4.22–5.81)
WBC: 9.7 10*3/uL (ref 4.0–10.5)

## 2012-04-03 LAB — BASIC METABOLIC PANEL
CO2: 27 mEq/L (ref 19–32)
Chloride: 98 mEq/L (ref 96–112)
Glucose, Bld: 94 mg/dL (ref 70–99)
Potassium: 3.9 mEq/L (ref 3.5–5.1)
Sodium: 136 mEq/L (ref 135–145)

## 2012-04-03 MED ORDER — HYDROMORPHONE 0.3 MG/ML IV SOLN
INTRAVENOUS | Status: DC
  Administered 2012-04-03: 15 mg via INTRAVENOUS
  Administered 2012-04-03: 1.2 mg via INTRAVENOUS
  Administered 2012-04-04 (×2): 1.5 mg via INTRAVENOUS
  Administered 2012-04-04: 2.4 mg via INTRAVENOUS
  Administered 2012-04-04: 0.9 mg via INTRAVENOUS
  Administered 2012-04-04: 1.8 mg via INTRAVENOUS
  Administered 2012-04-04: 09:00:00 via INTRAVENOUS
  Filled 2012-04-03 (×2): qty 25

## 2012-04-03 MED ORDER — ONDANSETRON HCL 4 MG/2ML IJ SOLN: 4.0000 mg | Freq: Four times a day (QID) | INTRAMUSCULAR | Status: DC | PRN

## 2012-04-03 MED ORDER — DIPHENHYDRAMINE HCL 50 MG/ML IJ SOLN
12.5000 mg | Freq: Four times a day (QID) | INTRAMUSCULAR | Status: DC | PRN
  Administered 2012-04-04: 12.5 mg via INTRAVENOUS
  Filled 2012-04-03: qty 1

## 2012-04-03 MED ORDER — OXYCODONE HCL ER 10 MG PO T12A
20.0000 mg | EXTENDED_RELEASE_TABLET | Freq: Two times a day (BID) | ORAL | Status: DC
  Administered 2012-04-03 – 2012-04-04 (×3): 20 mg via ORAL
  Filled 2012-04-03 (×3): qty 2

## 2012-04-03 MED ORDER — SODIUM CHLORIDE 0.9 % IJ SOLN: 9.0000 mL | INTRAMUSCULAR | Status: DC | PRN

## 2012-04-03 MED ORDER — WHITE PETROLATUM GEL
Status: AC
  Administered 2012-04-03: 0.2
  Filled 2012-04-03: qty 5

## 2012-04-03 MED ORDER — NALOXONE HCL 0.4 MG/ML IJ SOLN: 0.4000 mg | INTRAMUSCULAR | Status: DC | PRN

## 2012-04-03 MED ORDER — DIPHENHYDRAMINE HCL 12.5 MG/5ML PO ELIX: 12.5000 mg | ORAL_SOLUTION | Freq: Four times a day (QID) | ORAL | Status: DC | PRN

## 2012-04-03 NOTE — Evaluation (Signed)
Occupational Therapy Evaluation Patient Details Name: Shawn Romero MRN: [id number] DOB: 1965-06-12 Today's Date: 04/03/2012 Time: 7253-6644 OT Time Calculation (min): 33 min  OT Assessment / Plan / Recommendation Clinical Impression  This 47 y.o. male admitted for lumbar fusion.  Pt. demonstrates the below listed deficits and will benefit from acute OT to maximize safety and independence with BADLs.  Pt. will benefit from AE instruction    OT Assessment  Patient needs continued OT Services    Follow Up Recommendations  No OT follow up;Supervision - Intermittent    Barriers to Discharge None    Equipment Recommendations       Recommendations for Other Services    Frequency  Min 2X/week    Precautions / Restrictions Precautions Precautions: Back;Fall Precaution Booklet Issued: Yes (comment) Precaution Comments: pt educated - pt with verbal understanding Required Braces or Orthoses: Spinal Brace Spinal Brace: Lumbar corset;Applied in sitting position Restrictions Weight Bearing Restrictions: No       ADL  Eating/Feeding: Independent Where Assessed - Eating/Feeding: Chair Grooming: Wash/dry hands;Min guard Where Assessed - Grooming: Supported standing Upper Body Bathing: Minimal assistance Where Assessed - Upper Body Bathing: Supported sitting Lower Body Bathing: Maximal assistance Where Assessed - Lower Body Bathing: Supported sit to stand Upper Body Dressing: Minimal assistance Where Assessed - Upper Body Dressing: Unsupported sitting Lower Body Dressing: +1 Total assistance Where Assessed - Lower Body Dressing: Supported sit to Pharmacist, hospital: Minimal assistance Statistician Method: Sit to Barista: Raised toilet seat with arms (or 3-in-1 over toilet) Toileting - Clothing Manipulation and Hygiene: Minimal assistance Where Assessed - Glass blower/designer Manipulation and Hygiene: Standing Equipment Used: Back brace;Rolling  walker Transfers/Ambulation Related to ADLs: Pt ambulates with min A with RW ADL Comments: Pt unable to cross ankles over knees.  Discussed options of AE for LB ADLs vs. having assistance. Pt will benefit from AE instruction    OT Diagnosis: Generalized weakness;Acute pain  OT Problem List: Decreased strength;Decreased knowledge of use of DME or AE;Decreased knowledge of precautions;Pain OT Treatment Interventions: Self-care/ADL training;DME and/or AE instruction;Patient/family education   OT Goals Acute Rehab OT Goals OT Goal Formulation: With patient Time For Goal Achievement: 04/10/12 Potential to Achieve Goals: Good ADL Goals Pt Will Perform Grooming: with supervision;Standing at sink ADL Goal: Grooming - Progress: Goal set today Pt Will Perform Lower Body Bathing: with supervision;Sit to stand from chair;Sit to stand from bed;with adaptive equipment ADL Goal: Lower Body Bathing - Progress: Goal set today Pt Will Perform Lower Body Dressing: with supervision;Sit to stand from chair;Sit to stand from bed;with adaptive equipment ADL Goal: Lower Body Dressing - Progress: Goal set today Pt Will Transfer to Toilet: with supervision;with DME;Ambulation;Maintaining back safety precautions ADL Goal: Toilet Transfer - Progress: Goal set today Pt Will Perform Toileting - Clothing Manipulation: with supervision;Standing ADL Goal: Toileting - Clothing Manipulation - Progress: Goal set today Pt Will Perform Toileting - Hygiene: with supervision;Sit to stand from 3-in-1/toilet ADL Goal: Toileting - Hygiene - Progress: Goal set today Pt Will Perform Tub/Shower Transfer: with supervision;Ambulation;with DME;Maintaining back safety precautions ADL Goal: Tub/Shower Transfer - Progress: Goal set today Additional ADL Goal #1: Pt will be independent with back precautions during ADLs ADL Goal: Additional Goal #1 - Progress: Goal set today  Visit Information  Assistance Needed: +1 PT/OT  Co-Evaluation/Treatment: Yes    Subjective Data  Subjective: "This just hurts"  "Am I gonna rip the stitches?" Patient Stated Goal: To reduce pain   Prior Functioning  Home Living Lives With: Spouse;Family Available Help at Discharge: Family;Available 24 hours/day Type of Home: House Home Access: Stairs to enter [employer] of Steps: 4-8 Entrance Stairs-Rails: None Home Layout: Two level;Able to live on main level with bedroom/bathroom Bathroom Shower/Tub: Tub/shower unit;Curtain Bathroom Toilet: Standard Home Adaptive Equipment: Raised toilet seat with rails Additional Comments: Pt had a RW after THA, but gave it to a friend Prior Function Level of Independence: Independent Able to Take Stairs?: Yes Driving: Yes Vocation: Full time 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, 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 Dominant Hand: Right         Vision/Perception Vision - History Baseline Vision: No visual deficits   Cognition  Cognition Overall Cognitive Status: Appears within functional limits for tasks assessed/performed Arousal/Alertness: Awake/alert Orientation Level: Oriented X4 / Intact Behavior During Session: Anxious    Extremity/Trunk Assessment Right Upper Extremity Assessment RUE ROM/Strength/Tone: WFL for tasks assessed RUE Sensation: WFL - Light Touch RUE Coordination: WFL - gross/fine motor Left Upper Extremity Assessment LUE ROM/Strength/Tone: WFL for tasks assessed LUE Sensation: WFL - Light Touch LUE Coordination: WFL - gross/fine motor Right Lower Extremity Assessment RLE ROM/Strength/Tone: WFL for tasks assessed RLE Sensation: WFL - Light Touch RLE Coordination: WFL - gross/fine motor Left Lower Extremity Assessment LLE ROM/Strength/Tone: Within functional levels LLE Sensation: WFL - Light Touch LLE Coordination: WFL - gross/fine motor Trunk Assessment Trunk Assessment: Normal     Mobility Bed  Mobility Bed Mobility: Rolling Right;Right Sidelying to Sit;Sitting - Scoot to [employer] of Bed Rolling Right: 5: Supervision Right Sidelying to Sit: 4: Min guard Sitting - Scoot to [employer] of Bed: 4: Min guard Details for Bed Mobility Assistance: v/c's for log roll technique to eliminate twisting Transfers Sit to Stand: 4: Min guard;With upper extremity assist;From bed Stand to Sit: 4: Min guard;With armrests;With upper extremity assist;To chair/3-in-1 Details for Transfer Assistance: v/c's for safety and hand placement     Exercise     Balance     End of Session OT - End of Session Equipment Utilized During Treatment: Back brace Activity Tolerance: Patient limited by pain Patient left: in chair;with call bell/phone within reach;with family/visitor present Nurse Communication: Mobility status  GO     Khian Remo, Ursula Alert M 04/03/2012, 12:21 PM

## 2012-04-03 NOTE — Progress Notes (Signed)
NCM spoke to pt and states he wants an oversized RW. He is 262lbs and 6'5". Will order with [facility]. Pt is interested in reclining lift chair. Explained he will need to follow up his PCP for order for reclining lift chair. Will also discuss with his PCP about hospital bed. HH PT is not recommended for [facility]. Pt wife is at home to assist him with his care. Isidoro Donning RN CCM Case Mgmt phone [phone]

## 2012-04-03 NOTE — Progress Notes (Signed)
Patient's foley cath d/c'd at 0620 without any difficulties.

## 2012-04-03 NOTE — Clinical Social Work Note (Signed)
Clinical Social Work  CSW received consult for SNF. CSW reviewed chart and discussed pt with RN during progression. Awaiting PT/OT evals for discharge recommendations. CSW will assess pt, if appropriate. Please call with any urgent needs. CSW will continue to follow.   Dede Query, MSW, Theresia Majors [phone]

## 2012-04-03 NOTE — Evaluation (Signed)
Physical Therapy Evaluation Patient Details Name: Shawn Romero MRN: [id number] DOB: 14-May-1965 Today's Date: 04/03/2012 Time: 9562-1308 PT Time Calculation (min): 42 min  PT Assessment / Plan / Recommendation Clinical Impression  Pt is 47 yo male s/p PLF x 1 level mobilizing well. Pt with anxiety re: back pain and causing damage to surgical site with mobility. Patient and mother educated on back precautions and safe transfer and amb techniques. Pt safe to d/c home with 24/7 supevision and use of RW when medically appropriate. Will trial steps tomorrow 2/5.    PT Assessment  Patient needs continued PT services    Follow Up Recommendations  Supervision/Assistance - 24 hour;No PT follow up    Does the patient have the potential to tolerate intense rehabilitation      Barriers to Discharge None      Equipment Recommendations  Rolling walker with 5" wheels    Recommendations for Other Services     Frequency Min 5X/week    Precautions / Restrictions Precautions Precautions: Back;Fall Precaution Booklet Issued: Yes (comment) Precaution Comments: pt educated - pt with verbal understanding Required Braces or Orthoses: Spinal Brace Spinal Brace: Lumbar corset;Applied in sitting position Restrictions Weight Bearing Restrictions: No   Pertinent Vitals/Pain 8/10 surgical back pain      Mobility  Bed Mobility Bed Mobility: Rolling Right;Right Sidelying to Sit;Sitting - Scoot to [employer] of Bed Rolling Right: 5: Supervision Right Sidelying to Sit: 4: Min guard Sitting - Scoot to [place] of Bed: 4: Min guard Details for Bed Mobility Assistance: v/c's for log roll technique to eliminate twisting Transfers Transfers: Sit to Stand;Stand to Sit Sit to Stand: 4: Min guard;With upper extremity assist;From bed Stand to Sit: 4: Min guard;With armrests;With upper extremity assist;To chair/3-in-1 Details for Transfer Assistance: v/c's for safety and hand  placement Ambulation/Gait Ambulation/Gait Assistance: 4: Min guard Ambulation Distance (Feet): 100 Feet Assistive device: Rolling walker Ambulation/Gait Assistance Details: excessive bilat UE WBing, c/o "my triceps are wearing out." Pt c/o "I can't bear weight through my left leg." Gait Pattern: Step-through pattern;Decreased stride length;Decreased step length - right;Decreased stance time - left;Decreased weight shift to left Gait velocity: guarded but WFL Stairs: No    Exercises     PT Diagnosis: Difficulty walking;Acute pain  PT Problem List: Decreased activity tolerance;Decreased balance;Decreased mobility;Decreased knowledge of precautions PT Treatment Interventions: DME instruction;Gait training;Stair training;Functional mobility training;Therapeutic activities;Therapeutic exercise   PT Goals Acute Rehab PT Goals PT Goal Formulation: With patient Time For Goal Achievement: 04/10/12 Potential to Achieve Goals: Good Pt will Roll Supine to Right Side: with modified independence PT Goal: Rolling Supine to Right Side - Progress: Goal set today Pt will go Supine/Side to Sit: with modified independence PT Goal: Supine/Side to Sit - Progress: Goal set today Pt will go Sit to Supine/Side: with modified independence;with HOB 0 degrees PT Goal: Sit to Supine/Side - Progress: Goal set today Pt will go Sit to Stand: with modified independence;with upper extremity assist (up to RW) PT Goal: Sit to Stand - Progress: Goal set today Pt will Ambulate: >150 feet;with modified independence;with rolling walker PT Goal: Ambulate - Progress: Goal set today Pt will Go Up / Down Stairs: 6-9 stairs;with min assist (via HHA or backwards with RW) PT Goal: Up/Down Stairs - Progress: Goal set today Additional Goals Additional Goal #1: Pt independenet with recall of back precautions and 100% compliance. PT Goal: Additional Goal #1 - Progress: Goal set today  Visit Information  Last PT Received On:  04/03/12 Assistance Needed: +  1 PT/OT Co-Evaluation/Treatment: Yes    Subjective Data  Subjective: Pt received supine in bed with c/o 8/10 surgical back pain.   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 Dominant Hand: Right    Cognition  Cognition Overall Cognitive Status: Appears within functional limits for tasks assessed/performed Arousal/Alertness: Awake/alert Orientation Level: Oriented X4 / Intact Behavior During Session: Anxious    Extremity/Trunk Assessment Right Upper Extremity Assessment RUE ROM/Strength/Tone: WFL for tasks assessed RUE Sensation: WFL - Light Touch RUE Coordination: WFL - gross/fine motor Left Upper Extremity Assessment LUE ROM/Strength/Tone: WFL for tasks assessed LUE Sensation: WFL - Light Touch LUE Coordination: WFL - gross/fine motor Right Lower Extremity Assessment RLE ROM/Strength/Tone: WFL for tasks assessed RLE Sensation: WFL - Light Touch RLE Coordination: WFL - gross/fine motor Left Lower Extremity Assessment LLE ROM/Strength/Tone: Within functional levels LLE Sensation: WFL - Light Touch LLE Coordination: WFL - gross/fine motor Trunk Assessment Trunk Assessment: Normal   Balance    End of Session PT - End of Session Equipment Utilized During Treatment: Gait belt;Back brace Activity Tolerance: Patient tolerated treatment well Patient left:  in chair;with call bell/phone within reach;with family/visitor present Nurse Communication: Mobility status  GP     Marcene Brawn 04/03/2012, 11:05 AM  Lewis Shock, PT, DPT Pager #: [phone] Office #: [phone]

## 2012-04-03 NOTE — Progress Notes (Signed)
Patient ID: Shawn Romero, male   DOB: October 11, 1965, 47 y.o.   MRN: [id number] Subjective:  The patient is alert and pleasant. His PCA was discontinued last night. He is complaining that he does not getting adequate pain control on his current medical regimen.  Objective: Vital signs in last 24 hours: Temp:  [97.9 F (36.6 C)-99.9 F (37.7 C)] 98.4 F (36.9 C) (02/04 0544) Pulse Rate:  [94-114] 101  (02/04 0544) Resp:  [10-22] 18  (02/04 0544) BP: (127-167)/(46-90) 130/71 mmHg (02/04 0544) SpO2:  [90 %-100 %] 98 % (02/04 0544) Weight:  [118.933 kg (262 lb 3.2 oz)-120.4 kg (265 lb 6.9 oz)] 118.933 kg (262 lb 3.2 oz) (02/04 0001)  Intake/Output from previous day: 02/03 0701 - 02/04 0700 In: 3850 [I.V.:3600; IV Piggyback:250] Out: 3650 [Urine:3250; Blood:400] Intake/Output this shift:    Physical exam the patient is alert and oriented. His strength is normal his bilateral gastrocnemius and extensor hallucis longus.  Lab Results:  [facility] 04/03/12 0618  WBC 9.7  HGB 13.4  HCT 37.6*  PLT 206   BMET No results found for this basename: NA:2,K:2,CL:2,CO2:2,GLUCOSE:2,BUN:2,CREATININE:2,CALCIUM:2 in the last 72 hours  Studies/Results: Dg Lumbar Spine 2-3 Views  04/02/2012  *RADIOLOGY REPORT*  Clinical Data: L4-L5 PLIF.  DG C-ARM 1-60 MIN,LUMBAR SPINE - 2-3 VIEW  Comparison:  Intraoperative localization views same date. Abdominal CT 09/15/2006.  Findings: The lumbosacral anatomy appears somewhat transitional on prior examinations.  As correlated with the CT, it is assumed that there are small ribs at T12 and that the last open disc space is L5- S1.  This corresponds with the numbering utilized on the radiograph performed earlier today.  Spot fluoroscopic images of the lower lumbar spine demonstrate the placement of bilateral pedicle screws at L4 and L5.  The interconnecting rods have not yet been placed. Interbody spacer appears well positioned.  The patient is status post previous left  pelvic plate and screw reconstruction.  IMPRESSION: Intraoperative views during fusion at L4-L5.   Original Report Authenticated By: Carey Bullocks, M.D.    Dg Lumbar Spine 1 View  04/02/2012  *RADIOLOGY REPORT*  Clinical Data: PLIF L4-L5.  LUMBAR SPINE - 1 VIEW  Comparison: Pelvis 09/09/2009  Findings: Single operative view of the lumbar spine was obtained. Plate and screw fixation in the pelvis.  There is an IVC filter. Surgical marking along the posterior aspect of the L3-L4 disc space.  Surgical material posterior to the L4 vertebral body.  IMPRESSION: Surgical marking along the posterior aspect of L3-L4.   Original Report Authenticated By: Richarda Overlie, M.D.    Dg C-arm 1-60 Min  04/02/2012  *RADIOLOGY REPORT*  Clinical Data: L4-L5 PLIF.  DG C-ARM 1-60 MIN,LUMBAR SPINE - 2-3 VIEW  Comparison:  Intraoperative localization views same date. Abdominal CT 09/15/2006.  Findings: The lumbosacral anatomy appears somewhat transitional on prior examinations.  As correlated with the CT, it is assumed that there are small ribs at T12 and that the last open disc space is L5- S1.  This corresponds with the numbering utilized on the radiograph performed earlier today.  Spot fluoroscopic images of the lower lumbar spine demonstrate the placement of bilateral pedicle screws at L4 and L5.  The interconnecting rods have not yet been placed. Interbody spacer appears well positioned.  The patient is status post previous left pelvic plate and screw reconstruction.  IMPRESSION: Intraoperative views during fusion at L4-L5.   Original Report Authenticated By: Carey Bullocks, M.D.     Assessment/Plan: Postop day 1: We  will mobilize the patient with PT and OT. We will discontinue his Foley catheter.  Pain management: The patient has a high tolerance for pain medications due to his chronic narcotic usage. I will start a Dilaudid PCA pump and increase his by mouth medications to try to get better pain control.  LOS: 1 day      Alicya Bena D 04/03/2012, 7:31 AM

## 2012-04-03 NOTE — Clinical Social Work Note (Signed)
Clinical Social Work   CSW reviewed chart and updated RNCM. PT is recommending no PT follow with DME. CSW is signing off as no further needs identified. Please reconsult if a need arises prior to discharge.   Dede Query, MSW, Theresia Majors [phone]

## 2012-04-04 MED ORDER — OXYCODONE HCL ER 10 MG PO T12A
40.0000 mg | EXTENDED_RELEASE_TABLET | Freq: Two times a day (BID) | ORAL | Status: DC
  Administered 2012-04-04 – 2012-04-05 (×2): 40 mg via ORAL
  Filled 2012-04-04 (×2): qty 4

## 2012-04-04 MED FILL — Sodium Chloride Irrigation Soln 0.9%: Qty: 3000 | Status: AC

## 2012-04-04 MED FILL — Heparin Sodium (Porcine) Inj 1000 Unit/ML: INTRAMUSCULAR | Qty: 30 | Status: AC

## 2012-04-04 MED FILL — Sodium Chloride IV Soln 0.9%: INTRAVENOUS | Qty: 1000 | Status: AC

## 2012-04-04 NOTE — Progress Notes (Signed)
Discontinued PCA pump per MD order. Started PO pain medication.  Ambulated pt around entire unit, standby assist with walker.  Tolerated ambulation well and c/o minimal pain upon return to bed.  Call bell within reach. Will continue to monitor.

## 2012-04-04 NOTE — Progress Notes (Signed)
Patient ID: Shawn Romero, male   DOB: 1965-08-15, 47 y.o.   MRN: [id number] Subjective:  The patient is alert and pleasant. He feels much better than yesterday. He is pleased with his progress.  Objective: Vital signs in last 24 hours: Temp:  [97.7 F (36.5 C)-99.6 F (37.6 C)] 97.7 F (36.5 C) (02/05 1350) Pulse Rate:  [93-115] 104  (02/05 1350) Resp:  [15-19] 18  (02/05 1533) BP: (123-144)/(67-78) 123/72 mmHg (02/05 1350) SpO2:  [94 %-100 %] 97 % (02/05 1533) FiO2 (%):  [94 %-99 %] 99 % (02/05 0400)  Intake/Output from previous day: 02/04 0701 - 02/05 0700 In: 912.5 [P.O.:300; I.V.:612.5] Out: 700 [Urine:700] Intake/Output this shift:    Physical exam the patient is alert and oriented. His strength is normal his lower extremities.  Lab Results:  [facility] 04/03/12 0618  WBC 9.7  HGB 13.4  HCT 37.6*  PLT 206   BMET  Basename 04/03/12 0618  NA 136  K 3.9  CL 98  CO2 27  GLUCOSE 94  BUN 8  CREATININE 0.86  CALCIUM 9.3    Studies/Results: No results found.  Assessment/Plan: Postop day #2: The patient is doing well. I will discontinue his PCA pump and we'll likely go home tomorrow.  LOS: 2 days     Katena Petitjean D 04/04/2012, 3:39 PM

## 2012-04-04 NOTE — Progress Notes (Signed)
Patient wife states that she would prefer that patient stays to Friday to make sure pain is very well under control.

## 2012-04-04 NOTE — Progress Notes (Signed)
Occupational Therapy Treatment Patient Details Name: Shawn Romero MRN: [id number] DOB: 1965/05/17 Today's Date: 04/04/2012 Time: 1121-1205 OT Time Calculation (min): 44 min  OT Assessment / Plan / Recommendation Comments on Treatment Session Pt moving much better this morning.  Pain better controlled. Pt initially required coaxing to participate, but once up was very eager to work.  He has been instructed in AE and safety with ADLs.    Follow Up Recommendations  No OT follow up;Supervision - Intermittent    Barriers to Discharge       Equipment Recommendations  None recommended by OT    Recommendations for Other Services    Frequency Min 2X/week   Plan Discharge plan remains appropriate    Precautions / Restrictions Precautions Precautions: Back;Fall Precaution Booklet Issued: Yes (comment) Precaution Comments: Pt able to state precautions and demonstrates good understanding Required Braces or Orthoses: Spinal Brace Spinal Brace: Lumbar corset;Applied in sitting position Restrictions Weight Bearing Restrictions: No   Pertinent Vitals/Pain     ADL  Lower Body Bathing: Supervision/safety (with AE) Where Assessed - Lower Body Bathing: Supported sit to stand Lower Body Dressing: Supervision/safety (with reacher) Where Assessed - Lower Body Dressing: Supported sit to stand Equipment Used: Back brace;Rolling walker Transfers/Ambulation Related to ADLs: supervision ADL Comments: Pt. instructed in use of AE.  Pt able to don socks with reacher and did not want to practice with sock aid.  Pt. instructed where to purchase reacher, and LH sponge.  Pt anxious about disrupting surgery and hurting something.  Encouragement provided, and reviewed precautions with him.  Encouraged OOB as much as he can tolerate, and explained rationale. Pt ambulated 2x around unit with OT    OT Diagnosis:    OT Problem List:   OT Treatment Interventions:     OT Goals ADL Goals ADL Goal: Lower Body  Bathing - Progress: Progressing toward goals ADL Goal: Lower Body Dressing - Progress: Progressing toward goals ADL Goal: Additional Goal #1 - Progress: Progressing toward goals  Visit Information  Last OT Received On: 04/04/12 Assistance Needed: +1    Subjective Data      Prior Functioning       Cognition  Cognition Overall Cognitive Status: Appears within functional limits for tasks assessed/performed Arousal/Alertness: Awake/alert Orientation Level: Oriented X4 / Intact Behavior During Session: [facility] for tasks performed    Mobility  Bed Mobility Bed Mobility: Rolling Right;Right Sidelying to Sit;Sitting - Scoot to Edge of Bed Rolling Right: 5: Supervision Right Sidelying to Sit: 5: Supervision;With rails;HOB flat Sitting - Scoot to Edge of Bed: 5: Supervision;With rail Details for Bed Mobility Assistance: No cues needed Transfers Transfers: Sit to Stand;Stand to Sit Sit to Stand: 5: Supervision;From bed;With upper extremity assist Stand to Sit: 5: Supervision;To chair/3-in-1;With upper extremity assist    Exercises      Balance     End of Session OT - End of Session Equipment Utilized During Treatment: Back brace Activity Tolerance: Patient tolerated treatment well Patient left: in chair;with call bell/phone within reach;with family/visitor present  GO     Osby Sweetin, Ursula Alert M 04/04/2012, 12:19 PM

## 2012-04-04 NOTE — Progress Notes (Signed)
Physical Therapy Treatment Patient Details Name: Shawn Romero MRN: [id number] DOB: 11/25/1965 Today's Date: 04/04/2012 Time: 1478-2956 PT Time Calculation (min): 25 min  PT Assessment / Plan / Recommendation Comments on Treatment Session  Pt making good progress with mobility.  Pain better controlled today.  Pt ambulated ~700' with RW vs pushing IV pole.  Pt also able to initiate stair training this session (performed with 1 rail), but will plan to perform again prior to d/c home & perform with HHA vs RW    Follow Up Recommendations  Supervision/Assistance - 24 hour;No PT follow up     Does the patient have the potential to tolerate intense rehabilitation     Barriers to Discharge        Equipment Recommendations  Rolling walker with 5" wheels    Recommendations for Other Services    Frequency Min 5X/week   Plan Discharge plan remains appropriate    Precautions / Restrictions Precautions Precautions: Back;Fall Precaution Booklet Issued: Yes (comment) Precaution Comments: Pt able to state precautions and demonstrates good understanding Required Braces or Orthoses: Spinal Brace Spinal Brace: Lumbar corset;Applied in sitting position Restrictions Weight Bearing Restrictions: No       Mobility  Bed Mobility Bed Mobility: Sit to Sidelying Right Rolling Right: 5: Supervision Right Sidelying to Sit: 5: Supervision;With rails;HOB flat Sitting - Scoot to Edge of Bed: 5: Supervision;With rail Sit to Sidelying Right: 4: Min guard;HOB flat Details for Bed Mobility Assistance: No cues needed Transfers Transfers: Sit to Stand;Stand to Sit Sit to Stand: 5: Supervision;With upper extremity assist;With armrests;From chair/3-in-1 Stand to Sit: 5: Supervision;With upper extremity assist;To bed Details for Transfer Assistance: Cues to reinforce hand placement Ambulation/Gait Ambulation/Gait Assistance: 4: Min guard Ambulation Distance (Feet): 700 Feet Assistive device: Rolling  walker;Other (Comment) (pushing IV pole) Ambulation/Gait Assistance Details: Ambulated ~500' with RW then transitioned to pushing IV pole.  Increased limp noted with pushing IV pole.  Cues for tall posture with use of RW.   Gait Pattern: Step-through pattern;Decreased stride length Stairs: Yes Stairs Assistance: 4: Min guard Stairs Assistance Details (indicate cue type and reason): Cues for safety & technique.  Encouraged pt to perform step-to pattern rather than alternating at this time.   Stair Management Technique: One rail Right;Step to pattern;Forwards Number of Stairs: 3  (2x's.  ) Wheelchair Mobility Wheelchair Mobility: No      PT Goals Acute Rehab PT Goals Time For Goal Achievement: 04/10/12 Potential to Achieve Goals: Good Pt will Roll Supine to Right Side: with modified independence Pt will go Supine/Side to Sit: with modified independence Pt will go Sit to Supine/Side: with modified independence;with HOB 0 degrees PT Goal: Sit to Supine/Side - Progress: Progressing toward goal Pt will go Sit to Stand: with modified independence;with upper extremity assist PT Goal: Sit to Stand - Progress: Progressing toward goal Pt will Ambulate: >150 feet;with modified independence;with rolling walker PT Goal: Ambulate - Progress: Progressing toward goal Pt will Go Up / Down Stairs: 6-9 stairs;with min assist PT Goal: Up/Down Stairs - Progress: Progressing toward goal Additional Goals Additional Goal #1: Pt independenet with recall of back precautions and 100% compliance. PT Goal: Additional Goal #1 - Progress: Progressing toward goal  Visit Information  Last PT Received On: 04/04/12 Assistance Needed: +1    Subjective Data      Cognition  Cognition Overall Cognitive Status: Appears within functional limits for tasks assessed/performed Arousal/Alertness: Awake/alert Orientation Level: Oriented X4 / Intact Behavior During Session: [facility] for tasks performed  Balance     End  of Session PT - End of Session Equipment Utilized During Treatment: Gait belt;Back brace Activity Tolerance: Patient tolerated treatment well Patient left: in bed;with call bell/phone within reach;with family/visitor present Nurse Communication: Mobility status     Verdell Face, [place] [phone] 04/04/2012

## 2012-04-05 MED ORDER — OXYCODONE HCL ER 40 MG PO T12A: 40.0000 mg | EXTENDED_RELEASE_TABLET | Freq: Two times a day (BID) | ORAL | Status: AC

## 2012-04-05 MED ORDER — DIAZEPAM 5 MG PO TABS: 5.0000 mg | ORAL_TABLET | Freq: Four times a day (QID) | ORAL | Status: AC | PRN

## 2012-04-05 MED ORDER — HYDROMORPHONE HCL 4 MG PO TABS: 4.0000 mg | ORAL_TABLET | ORAL | Status: AC | PRN

## 2012-04-05 NOTE — Plan of Care (Signed)
Problem: Consults Goal: Diagnosis - Spinal Surgery Outcome: Completed/Met Date Met:  04/05/12 [place]/Lumbar Spine Fusion

## 2012-04-05 NOTE — Discharge Summary (Signed)
Physician Discharge Summary  Patient ID: Shawn Romero MRN: [id number] DOB/AGE: March 14, 1965 47 y.o.  Admit date: 04/02/2012 Discharge date: 04/05/2012  Admission Diagnoses: L4-5 spondylolisthesis, spinal stenosis, facet arthropathy, lumbago, lumbar radiculopathy, neurogenic claudication  Discharge Diagnoses: The same Active Problems:  * No active hospital problems. *    Discharged Condition: good  Hospital Course: I admitted the patient to [facility] on 04/02/2012. On that day I performed an L4-5 decompression, instrumentation, and fusion. The surgery went well.  The patient's postoperative course was unremarkable. On postop day #3 he requested discharge to home. He was given oral and written discharge instructions. All his, and his mother's, questions were answered. The patient already feels better since surgery and is pleased with his progress.  Consults: None Significant Diagnostic Studies: None Treatments: L4-5 decompression, instrumentation, and fusion. Discharge Exam: Blood pressure 100/61, pulse 113, temperature 97.4 F (36.3 C), temperature source Oral, resp. rate 20, height 6' 4.5" (1.943 m), weight 118.933 kg (262 lb 3.2 oz), SpO2 96.00%. The patient is alert and oriented. His strength is normal his lower extremities. His dressing is clean and dry.  Disposition: Home  Discharge Orders    Future Orders Please Complete By Expires   Diet - low sodium heart healthy      Increase activity slowly      Discharge instructions      Comments:   Call [phone] for followup appointment.   No dressing needed      Call MD for:  temperature >100.4      Call MD for:  persistant nausea and vomiting      Call MD for:  severe uncontrolled pain      Call MD for:  redness, tenderness, or signs of infection (pain, swelling, redness, odor or green/yellow discharge around incision site)      Call MD for:  difficulty breathing, headache or visual disturbances      Call MD for:   hives      Call MD for:  persistant dizziness or light-headedness      Call MD for:  extreme fatigue          Medication List     As of 04/05/2012 11:30 AM    STOP taking these medications         carisoprodol 350 MG tablet   Commonly known as: SOMA      TAKE these medications         diazepam 5 MG tablet   Commonly known as: VALIUM   Take 1 tablet (5 mg total) by mouth every 6 (six) hours as needed.      HYDROmorphone 4 MG tablet   Commonly known as: DILAUDID   Take 1 tablet (4 mg total) by mouth every 4 (four) hours as needed.      HYDROmorphone 4 MG tablet   Commonly known as: DILAUDID   Take 4 mg by mouth every 6 (six) hours as needed. For pain      lamoTRIgine 100 MG tablet   Commonly known as: LAMICTAL   Take 100 mg by mouth daily.      omeprazole 20 MG capsule   Commonly known as: PRILOSEC   Take 20 mg by mouth daily.      OxyCODONE 40 mg T12a   Commonly known as: OXYCONTIN   Take 1 tablet (40 mg total) by mouth every 12 (twelve) hours.      traZODone 150 MG tablet   Commonly known as: DESYREL   Take  300 mg by mouth at bedtime.      VIIBRYD 20 MG Tabs   Generic drug: Vilazodone HCl   Take 1 tablet by mouth daily.         SignedTressie Stalker D 04/05/2012, 11:30 AM

## 2012-04-05 NOTE — Progress Notes (Signed)
Physical Therapy Treatment Patient Details Name: Rhoderick Farrel MRN: [id number] DOB: 02/13/1966 Today's Date: 04/05/2012 Time: 1478-2956 PT Time Calculation (min): 17 min  PT Assessment / Plan / Recommendation Comments on Treatment Session  Pt cont's to demonstrate good mobility & safe technique with activities.  Pt ambulating entire 4N unit with no AD but encouraged use of RW to ensure safety with increased distances initially when d/c'd-- pt agreeable.      Follow Up Recommendations  Supervision/Assistance - 24 hour;No PT follow up     Does the patient have the potential to tolerate intense rehabilitation     Barriers to Discharge        Equipment Recommendations  Rolling walker with 5" wheels    Recommendations for Other Services    Frequency Min 5X/week   Plan Discharge plan remains appropriate    Precautions / Restrictions Precautions Precautions: Back;Fall Precaution Comments: Pt able to state precautions and demonstrates good understanding Required Braces or Orthoses: Spinal Brace Spinal Brace: Lumbar corset;Applied in sitting position Restrictions Weight Bearing Restrictions: No   Pertinent Vitals/Pain C/o back pain.  Not rated.     Mobility  Bed Mobility Bed Mobility: Not assessed Rolling Right: 6: Modified independent (Device/Increase time) Transfers Transfers: Sit to Stand;Stand to Sit Sit to Stand: 6: Modified independent (Device/Increase time);With upper extremity assist;With armrests;From chair/3-in-1 Stand to Sit: 6: Modified independent (Device/Increase time);With upper extremity assist;With armrests;To chair/3-in-1 Ambulation/Gait Ambulation/Gait Assistance: 5: Supervision Ambulation Distance (Feet): 500 Feet Assistive device: None Ambulation/Gait Assistance Details: (S) for safety.  Ambulates with no AD but is cautious & guarded.  Encouraged use of RW for increased distances to ensure safety.   Gait Pattern: Step-through pattern;Decreased stride  length;Wide base of support Gait velocity: guarded but WFL Stairs: Yes Stairs Assistance: 4: Min assist Stairs Assistance Details (indicate cue type and reason): Pt reports he has 2 strong sons that will be present to (A) with up/down steps.  Pt used Lt HHA + Rt rail for bil UE support (sons will be able to provide assist).  Pt deferring backwards technique with use of RW due to being fearful of technique.   Stair Management Technique: One rail Right;Step to pattern;Other (comment) (HHA Lt ) Number of Stairs: 6  (2x's) Wheelchair Mobility Wheelchair Mobility: No      PT Goals Acute Rehab PT Goals Time For Goal Achievement: 04/10/12 Potential to Achieve Goals: Good Pt will Roll Supine to Right Side: with modified independence Pt will go Supine/Side to Sit: with modified independence Pt will go Sit to Supine/Side: with modified independence;with HOB 0 degrees Pt will go Sit to Stand: with modified independence;with upper extremity assist PT Goal: Sit to Stand - Progress: Met Pt will Ambulate: >150 feet;with modified independence;with rolling walker PT Goal: Ambulate - Progress: Progressing toward goal Pt will Go Up / Down Stairs: 6-9 stairs;with min assist PT Goal: Up/Down Stairs - Progress: Met Additional Goals Additional Goal #1: Pt independenet with recall of back precautions and 100% compliance. PT Goal: Additional Goal #1 - Progress: Met  Visit Information  Last PT Received On: 04/05/12 Assistance Needed: +1    Subjective Data      Cognition  Cognition Overall Cognitive Status: Appears within functional limits for tasks assessed/performed Arousal/Alertness: Awake/alert Orientation Level: Oriented X4 / Intact Behavior During Session: [facility] for tasks performed    Balance     End of Session PT - End of Session Equipment Utilized During Treatment: Gait belt;Back brace Activity Tolerance: Patient tolerated treatment well Nurse  Communication: Mobility status     Verdell Face, [place] [phone] 04/05/2012

## 2012-04-05 NOTE — Progress Notes (Signed)
Occupational Therapy Treatment Patient Details Name: Shawn Romero MRN: [id number] DOB: [birth year] Today's Date: 04/05/2012 Time: 4098-1191 OT Time Calculation (min): 20 min  OT Assessment / Plan / Recommendation Comments on Treatment Session Pt experiencing more pain after and increase in activity today per his report.  All questions answered in regard to tub transfers and equipment.  Pt generalized back precautions independently.    Follow Up Recommendations  No OT follow up;Supervision - Intermittent    Barriers to Discharge       Equipment Recommendations  None recommended by OT    Recommendations for Other Services    Frequency Min 2X/week   Plan Discharge plan remains appropriate    Precautions / Restrictions Precautions Precautions: Back;Fall Precaution Comments: Pt able to state precautions and demonstrates good understanding Required Braces or Orthoses: Spinal Brace Spinal Brace: Lumbar corset;Applied in sitting position Restrictions Weight Bearing Restrictions: No   Pertinent Vitals/Pain Back, did not rate, premedicated, repositioned    ADL  Tub/Shower Transfer: Min guard Tub/Shower Transfer Method: Science writer:  (pt prefers to stand) Equipment Used: Back brace;Rolling walker Transfers/Ambulation Related to ADLs: instructed in which LE to lead as stepping over tub, where to stablize to prevent bending, options for alternatives to purchasing a tub seat, use of long sponge in standing while holding on for stability. ADL Comments: Verbally instructed in car transfer.  Pt plans to purchase AE before leaving hospital today.    OT Diagnosis:    OT Problem List:   OT Treatment Interventions:     OT Goals ADL Goals Pt Will Perform Grooming: with supervision;Standing at sink ADL Goal: Grooming - Progress: Met Pt Will Perform Lower Body Bathing: with supervision;Sit to stand from chair;Sit to stand from bed;with adaptive equipment Pt Will  Perform Lower Body Dressing: with supervision;Sit to stand from chair;Sit to stand from bed;with adaptive equipment Pt Will Transfer to Toilet: with supervision;with DME;Ambulation;Maintaining back safety precautions Pt Will Perform Toileting - Clothing Manipulation: with supervision;Standing Pt Will Perform Toileting - Hygiene: with supervision;Sit to stand from 3-in-1/toilet Pt Will Perform Tub/Shower Transfer: with supervision;Ambulation;with DME;Maintaining back safety precautions ADL Goal: Tub/Shower Transfer - Progress: Met Additional ADL Goal #1: Pt will be independent with back precautions during ADLs ADL Goal: Additional Goal #1 - Progress: Met  Visit Information  Last OT Received On: 04/05/12 Assistance Needed: +1    Subjective Data      Prior Functioning       Cognition  Cognition Overall Cognitive Status: Appears within functional limits for tasks assessed/performed Arousal/Alertness: Awake/alert Orientation Level: Oriented X4 / Intact Behavior During Session: [facility] for tasks performed    Mobility  Bed Mobility Bed Mobility: Sit to Sidelying Right Rolling Right: 6: Modified independent (Device/Increase time) Transfers Transfers: Sit to Stand;Stand to Sit Sit to Stand: 6: Modified independent (Device/Increase time);With upper extremity assist;From chair/3-in-1 Stand to Sit: 6: Modified independent (Device/Increase time);With upper extremity assist;To bed    Exercises      Balance     End of Session OT - End of Session Equipment Utilized During Treatment: Back brace Activity Tolerance: Patient tolerated treatment well Patient left: in bed;with call bell/phone within reach;with family/visitor present Nurse Communication:  (Pt ready for d/c.)  GO     Evern Bio 04/05/2012, 1:45 PM [phone]

## 2012-04-30 ENCOUNTER — Telehealth: Payer: Self-pay | Admitting: Internal Medicine

## 2012-05-08 ENCOUNTER — Other Ambulatory Visit: Payer: Self-pay | Admitting: Internal Medicine

## 2012-05-09 ENCOUNTER — Other Ambulatory Visit: Payer: Self-pay | Admitting: *Deleted

## 2013-02-13 ENCOUNTER — Other Ambulatory Visit: Payer: Self-pay | Admitting: Neurosurgery

## 2013-02-13 DIAGNOSIS — M549 Dorsalgia, unspecified: Secondary | ICD-10-CM

## 2013-02-18 ENCOUNTER — Other Ambulatory Visit: Payer: Managed Care, Other (non HMO)

## 2013-02-18 ENCOUNTER — Inpatient Hospital Stay: Admission: RE | Admit: 2013-02-18 | Payer: Managed Care, Other (non HMO) | Source: Ambulatory Visit

## 2013-12-02 ENCOUNTER — Other Ambulatory Visit: Payer: Self-pay | Admitting: Internal Medicine

## 2015-07-15 DIAGNOSIS — F908 Attention-deficit hyperactivity disorder, other type: Secondary | ICD-10-CM | POA: Diagnosis not present

## 2015-07-15 DIAGNOSIS — F3181 Bipolar II disorder: Secondary | ICD-10-CM | POA: Diagnosis not present

## 2015-08-05 DIAGNOSIS — F330 Major depressive disorder, recurrent, mild: Secondary | ICD-10-CM | POA: Diagnosis not present

## 2015-08-05 DIAGNOSIS — Z1389 Encounter for screening for other disorder: Secondary | ICD-10-CM | POA: Diagnosis not present

## 2015-08-05 DIAGNOSIS — F419 Anxiety disorder, unspecified: Secondary | ICD-10-CM | POA: Diagnosis not present

## 2015-08-05 DIAGNOSIS — F319 Bipolar disorder, unspecified: Secondary | ICD-10-CM | POA: Diagnosis not present

## 2015-08-05 DIAGNOSIS — M5136 Other intervertebral disc degeneration, lumbar region: Secondary | ICD-10-CM | POA: Diagnosis not present

## 2015-08-12 DIAGNOSIS — Z125 Encounter for screening for malignant neoplasm of prostate: Secondary | ICD-10-CM | POA: Diagnosis not present

## 2015-08-12 DIAGNOSIS — F319 Bipolar disorder, unspecified: Secondary | ICD-10-CM | POA: Diagnosis not present

## 2015-08-12 DIAGNOSIS — Z79899 Other long term (current) drug therapy: Secondary | ICD-10-CM | POA: Diagnosis not present

## 2015-08-12 DIAGNOSIS — Z0000 Encounter for general adult medical examination without abnormal findings: Secondary | ICD-10-CM | POA: Diagnosis not present

## 2015-08-20 DIAGNOSIS — Z0000 Encounter for general adult medical examination without abnormal findings: Secondary | ICD-10-CM | POA: Diagnosis not present

## 2015-08-20 DIAGNOSIS — F419 Anxiety disorder, unspecified: Secondary | ICD-10-CM | POA: Diagnosis not present

## 2015-08-20 DIAGNOSIS — F319 Bipolar disorder, unspecified: Secondary | ICD-10-CM | POA: Diagnosis not present

## 2015-08-20 DIAGNOSIS — Z6831 Body mass index (BMI) 31.0-31.9, adult: Secondary | ICD-10-CM | POA: Diagnosis not present

## 2015-08-20 DIAGNOSIS — F330 Major depressive disorder, recurrent, mild: Secondary | ICD-10-CM | POA: Diagnosis not present

## 2015-08-20 DIAGNOSIS — Z1389 Encounter for screening for other disorder: Secondary | ICD-10-CM | POA: Diagnosis not present

## 2015-09-17 DIAGNOSIS — R0602 Shortness of breath: Secondary | ICD-10-CM | POA: Diagnosis not present

## 2015-09-17 DIAGNOSIS — J209 Acute bronchitis, unspecified: Secondary | ICD-10-CM | POA: Diagnosis not present

## 2015-09-17 DIAGNOSIS — Z6831 Body mass index (BMI) 31.0-31.9, adult: Secondary | ICD-10-CM | POA: Diagnosis not present

## 2015-09-22 DIAGNOSIS — Z6831 Body mass index (BMI) 31.0-31.9, adult: Secondary | ICD-10-CM | POA: Diagnosis not present

## 2015-09-22 DIAGNOSIS — R0602 Shortness of breath: Secondary | ICD-10-CM | POA: Diagnosis not present

## 2015-09-22 DIAGNOSIS — J209 Acute bronchitis, unspecified: Secondary | ICD-10-CM | POA: Diagnosis not present

## 2015-10-14 DIAGNOSIS — J019 Acute sinusitis, unspecified: Secondary | ICD-10-CM | POA: Diagnosis not present

## 2015-10-14 DIAGNOSIS — Z6830 Body mass index (BMI) 30.0-30.9, adult: Secondary | ICD-10-CM | POA: Diagnosis not present

## 2015-10-14 DIAGNOSIS — Z1389 Encounter for screening for other disorder: Secondary | ICD-10-CM | POA: Diagnosis not present

## 2015-10-14 DIAGNOSIS — F5221 Male erectile disorder: Secondary | ICD-10-CM | POA: Diagnosis not present

## 2015-10-14 DIAGNOSIS — J209 Acute bronchitis, unspecified: Secondary | ICD-10-CM | POA: Diagnosis not present

## 2015-10-15 DIAGNOSIS — F3181 Bipolar II disorder: Secondary | ICD-10-CM | POA: Diagnosis not present

## 2015-10-15 DIAGNOSIS — F908 Attention-deficit hyperactivity disorder, other type: Secondary | ICD-10-CM | POA: Diagnosis not present

## 2015-11-19 DIAGNOSIS — F908 Attention-deficit hyperactivity disorder, other type: Secondary | ICD-10-CM | POA: Diagnosis not present

## 2015-11-19 DIAGNOSIS — F3181 Bipolar II disorder: Secondary | ICD-10-CM | POA: Diagnosis not present

## 2015-12-17 DIAGNOSIS — F609 Personality disorder, unspecified: Secondary | ICD-10-CM | POA: Diagnosis not present

## 2015-12-17 DIAGNOSIS — F3181 Bipolar II disorder: Secondary | ICD-10-CM | POA: Diagnosis not present

## 2015-12-17 DIAGNOSIS — F908 Attention-deficit hyperactivity disorder, other type: Secondary | ICD-10-CM | POA: Diagnosis not present

## 2015-12-18 DIAGNOSIS — F3181 Bipolar II disorder: Secondary | ICD-10-CM | POA: Diagnosis not present

## 2015-12-22 DIAGNOSIS — F3181 Bipolar II disorder: Secondary | ICD-10-CM | POA: Diagnosis not present

## 2015-12-30 ENCOUNTER — Encounter (HOSPITAL_COMMUNITY): Payer: Self-pay | Admitting: *Deleted

## 2015-12-30 DIAGNOSIS — Z79899 Other long term (current) drug therapy: Secondary | ICD-10-CM | POA: Insufficient documentation

## 2015-12-30 DIAGNOSIS — M545 Low back pain: Secondary | ICD-10-CM | POA: Insufficient documentation

## 2015-12-30 DIAGNOSIS — F17210 Nicotine dependence, cigarettes, uncomplicated: Secondary | ICD-10-CM | POA: Diagnosis not present

## 2015-12-30 MED ORDER — OXYCODONE-ACETAMINOPHEN 5-325 MG PO TABS
1.0000 | ORAL_TABLET | ORAL | Status: DC | PRN
  Administered 2015-12-30: 1 via ORAL
  Filled 2015-12-30 (×2): qty 1

## 2015-12-30 NOTE — ED Triage Notes (Signed)
Pt complains of lower back pain since picking up his daughter tonight. Pt has hx of spinal fusion, states the pain is in same area as spinal fusion.

## 2015-12-31 ENCOUNTER — Emergency Department (HOSPITAL_COMMUNITY)
Admission: EM | Admit: 2015-12-31 | Discharge: 2015-12-31 | Disposition: A | Discharge status: Home / Self Care | Payer: BLUE CROSS/BLUE SHIELD | Attending: Emergency Medicine | Admitting: Emergency Medicine

## 2015-12-31 ENCOUNTER — Emergency Department (HOSPITAL_COMMUNITY): Payer: BLUE CROSS/BLUE SHIELD

## 2015-12-31 ENCOUNTER — Encounter (HOSPITAL_COMMUNITY): Payer: Self-pay | Admitting: Emergency Medicine

## 2015-12-31 DIAGNOSIS — M545 Low back pain, unspecified: Secondary | ICD-10-CM

## 2015-12-31 DIAGNOSIS — F3181 Bipolar II disorder: Secondary | ICD-10-CM | POA: Diagnosis not present

## 2015-12-31 DIAGNOSIS — F411 Generalized anxiety disorder: Secondary | ICD-10-CM | POA: Diagnosis not present

## 2015-12-31 DIAGNOSIS — G8929 Other chronic pain: Secondary | ICD-10-CM

## 2015-12-31 MED ORDER — KETOROLAC TROMETHAMINE 60 MG/2ML IM SOLN
60.0000 mg | Freq: Once | INTRAMUSCULAR | Status: DC
  Filled 2015-12-31: qty 2

## 2015-12-31 MED ORDER — HYDROMORPHONE HCL 1 MG/ML IJ SOLN: 0.5000 mg | Freq: Once | INTRAMUSCULAR | Status: DC

## 2015-12-31 MED ORDER — METHOCARBAMOL 500 MG PO TABS
1000.0000 mg | ORAL_TABLET | Freq: Once | ORAL | Status: AC
  Administered 2015-12-31: 1000 mg via ORAL
  Filled 2015-12-31: qty 2

## 2015-12-31 MED ORDER — PREDNISONE 20 MG PO TABS: 40.0000 mg | ORAL_TABLET | Freq: Every day | ORAL | 0 refills | Status: AC

## 2015-12-31 MED ORDER — LIDOCAINE 5 % EX PTCH: 1.0000 | MEDICATED_PATCH | CUTANEOUS | 0 refills | Status: AC

## 2015-12-31 MED ORDER — HYDROMORPHONE HCL 1 MG/ML IJ SOLN
0.5000 mg | Freq: Once | INTRAMUSCULAR | Status: AC
  Administered 2015-12-31: 0.5 mg via INTRAMUSCULAR
  Filled 2015-12-31: qty 1

## 2015-12-31 MED ORDER — LIDOCAINE 5 % EX PTCH
1.0000 | MEDICATED_PATCH | CUTANEOUS | Status: DC
  Filled 2015-12-31: qty 1

## 2015-12-31 MED ORDER — DEXAMETHASONE SODIUM PHOSPHATE 10 MG/ML IJ SOLN
10.0000 mg | Freq: Once | INTRAMUSCULAR | Status: AC
  Administered 2015-12-31: 10 mg via INTRAMUSCULAR
  Filled 2015-12-31: qty 1

## 2015-12-31 NOTE — ED Notes (Signed)
Pt request to see  MD.

## 2015-12-31 NOTE — Discharge Instructions (Signed)
Read the information below.  Your x-ray was re-assuring. You were given pain medication and steroids in the ED.  Continue taking your lodine. You are being prescribed a short course of prednisone.  I have also prescribed lidocaine patches for symptomatic relief.  You can apply ice to affected area for 20 minute increments.  It is important that you follow up with Dr. Lovell SheehanJenkins for re-evaluation and further management of your pain.  Use the prescribed medication as directed.  Please discuss all new medications with your pharmacist.   You may return to the Emergency Department at any time for worsening condition or any new symptoms that concern you.

## 2015-12-31 NOTE — ED Notes (Signed)
Spent 30 minutes talking with patient.  Talked with him about the importance of follow up.  Patient aggressive with this Clinical research associatewriter.  Wants dilaudid.  States that he couldn't walk so this Clinical research associatewriter got him a wheelchair.  Pt jumped up out of the bed and walked without difficulty out of the department.  Ripped up his papers and threw them on the ground.  Pt walked out but then came right back and started harassing the Diplomatic Services operational officersecretary.  [employer] called.  Pt escorted off the property.

## 2015-12-31 NOTE — ED Provider Notes (Signed)
[facility]-EMERGENCY DEPT Provider Note   CSN: [id number] Arrival date & time: 12/30/15  2231     History   Chief Complaint Chief Complaint  Patient presents with  . Back Pain    HPI Shawn Romero is a 50 y.o. male.  Shawn Romero is a 50 y.o. male with h/o atypical depression, GERD, arthritis presents to ED with back pain. Patient had a L4/L5 spinal fusion approximately 3 years ago by Dr. Lovell SheehanJenkins and reports he has been pain free for 1 year. Patient states he was trying to pick up his 5'8", 180lbs daughter and felt his back pull and had sudden onset of pain. Reports pain worsens with standing and walking. Sitting and lying improves his pain. He denies fever, loss of bowel or bladder function, h/o cancer, h/o IVDU, saddle anesthesia, and numbness/weakness.  Patient reports taking his lodine PTA. Denies any other complaints at this time. Patient reports percocet does not work and makes him itch, review of records show patient has been prescribed oxycotin in past. Patient states the only medication that works for him is dilaudid and steroids.       Past Medical History:  Diagnosis Date  . Acetabular fracture (HCC)   . Arthritis   . Depression   . GERD (gastroesophageal reflux disease)     There are no active problems to display for this patient.   Past Surgical History:  Procedure Laterality Date  . HERNIA REPAIR  87   ing lft   . JOINT REPLACEMENT     total hip left 11  . REVISION OF TOTAL HIP ACETABULUM         Home Medications    Prior to Admission medications   Medication Sig Start Date End Date Taking? Authorizing Provider  diazepam (VALIUM) 5 MG tablet Take 1 tablet (5 mg total) by mouth every 6 (six) hours as needed. 04/05/12   Tressie StalkerJeffrey Jenkins, MD  HYDROmorphone (DILAUDID) 4 MG tablet Take 4 mg by mouth every 6 (six) hours as needed. For pain    Historical Provider, MD  HYDROmorphone (DILAUDID) 4 MG tablet Take 1 tablet (4 mg total) by mouth every 4 (four) hours as  needed. 04/05/12   Tressie StalkerJeffrey Jenkins, MD  lamoTRIgine (LAMICTAL) 100 MG tablet Take 100 mg by mouth daily.    Historical Provider, MD  lidocaine (LIDODERM) 5 % Place 1 patch onto the skin daily. Remove & Discard patch within 12 hours or as directed by MD 12/31/15   Lona KettleAshley Laurel Meyer, PA-C  omeprazole (PRILOSEC) 20 MG capsule Take 20 mg by mouth daily.    Historical Provider, MD  OxyCODONE (OXYCONTIN) 40 mg T12A Take 1 tablet (40 mg total) by mouth every 12 (twelve) hours. 04/05/12   Tressie StalkerJeffrey Jenkins, MD  predniSONE (DELTASONE) 20 MG tablet Take 2 tablets (40 mg total) by mouth daily. 12/31/15 01/04/16  Lona KettleAshley Laurel Meyer, PA-C  traZODone (DESYREL) 150 MG tablet Take 300 mg by mouth at bedtime.    Historical Provider, MD  Vilazodone HCl (VIIBRYD) 20 MG TABS Take 1 tablet by mouth daily.    Historical Provider, MD    Family History No family history on file.  Social History Social History  Substance Use Topics  . Smoking status: Current Every Day Smoker    Packs/day: 1.00    Years: 10.00    Types: Cigarettes  . Smokeless tobacco: Never Used  . Alcohol use No     Allergies   Review of patient's allergies indicates no known allergies.  Review of Systems Review of Systems  Constitutional: Negative for fever.  HENT: Negative for trouble swallowing.   Eyes: Negative for visual disturbance.  Respiratory: Negative for shortness of breath.   Cardiovascular: Negative for chest pain.  Gastrointestinal: Negative for abdominal pain, nausea and vomiting.  Genitourinary: Negative for dysuria and hematuria.       No loss of bowel or bladder function.   Musculoskeletal: Positive for arthralgias and back pain.  Skin: Negative for rash.  Neurological: Negative for dizziness, weakness, numbness and headaches.       No saddle anesthesia     Physical Exam Updated Vital Signs BP (!) 148/103 (BP Location: Left Arm)   Pulse 86   Temp 97.6 F (36.4 C) (Oral)   Resp 20   SpO2 96%   Physical Exam   Constitutional: He appears well-developed and well-nourished. No distress.  HENT:  Head: Normocephalic and atraumatic.  Mouth/Throat: Oropharynx is clear and moist. No oropharyngeal exudate.  Eyes: Conjunctivae and EOM are normal. Pupils are equal, round, and reactive to light. Right eye exhibits no discharge. Left eye exhibits no discharge. No scleral icterus.  Neck: Normal range of motion and phonation normal. Neck supple. No neck rigidity. Normal range of motion present.  Cardiovascular: Normal rate, regular rhythm, normal heart sounds and intact distal pulses.   No murmur heard. Pulmonary/Chest: Effort normal and breath sounds normal. No stridor. No respiratory distress. He has no wheezes. He has no rales.  Abdominal: Soft. Bowel sounds are normal. He exhibits no distension. There is no tenderness. There is no rigidity, no rebound, no guarding and no CVA tenderness.  Musculoskeletal: Normal range of motion.  TTP of lumbar spine and b/l lumbar paravertebral muscles, L > R. Well healed scar in lumbar spine. Patient is ambulatory.   Lymphadenopathy:    He has no cervical adenopathy.  Neurological: He is alert. He is not disoriented. Coordination and gait normal. GCS eye subscore is 4. GCS verbal subscore is 5. GCS motor subscore is 6.  Mental Status:  Alert, thought content appropriate, able to give a coherent history. Speech fluent without evidence of aphasia. Able to follow 2 step commands without difficulty.  Cranial Nerves:  II: pupils equal, round, reactive to light III,IV, VI: ptosis not present, extra-ocular motions intact bilaterally  V,VII: smile symmetric, facial light touch sensation equal VIII: hearing grossly normal to voice  X: uvula elevates symmetrically  XI: bilateral shoulder shrug symmetric and strong XII: midline tongue extension without fassiculations Motor:  Normal tone. 5/5 in upper extremities bilaterally, including strong and equal grip strength and  dorsiflexion/plantar flexion Sensory: light touch normal in all extremities. Cerebellar: normal finger-to-nose with bilateral upper extremities Gait: ambulatory CV: distal pulses palpable throughout   Skin: Skin is warm and dry. He is not diaphoretic.  Psychiatric: His affect is angry. He is agitated.  Patient has become increasingly angry and agitated.      ED Treatments / Results  Labs (all labs ordered are listed, but only abnormal results are displayed) Labs Reviewed - No data to display  EKG  EKG Interpretation None       Radiology Dg Lumbar Spine Complete  Result Date: 12/31/2015 CLINICAL DATA:  50 y/o M; history of lumbar surgery felt pop in the lower back with pain. EXAM: LUMBAR SPINE - COMPLETE 4+ VIEW COMPARISON:  12/19/2014 lumbar radiographs FINDINGS: Five lumbar type non-rib-bearing vertebral bodies. Small T12 ribs. L4-5 posterior instrumented fusion, laminectomy, and interbody fusion with intact hardware and without apparent  hardware related complication. L3-4 stable minimal retrolisthesis. Lumbar lordosis is maintained. Vertebral body heights are preserved. Discogenic degenerative changes with mild disc space narrowing and small marginal osteophytes greatest at the L3-4 and L5-S1 levels. No acute fracture identified. Partially visualized are multiple plates and screws fixing pelvic fractures. IVC filter. IMPRESSION: No acute fracture or dislocation is identified. Stable degenerative changes of the lumbar spine, posterior L4-5 instrumented and interbody fusion, and partially visualized left pelvic fixed chronic fractures. Electronically Signed   By: Mitzi Hansen M.D.   On: 12/31/2015 05:45    Procedures Procedures (including critical care time)  Medications Ordered in ED Medications  methocarbamol (ROBAXIN) tablet 1,000 mg (1,000 mg Oral Given 12/31/15 0501)  dexamethasone (DECADRON) injection 10 mg (10 mg Intramuscular Given 12/31/15 0548)  HYDROmorphone  (DILAUDID) injection 0.5 mg (0.5 mg Intramuscular Given 12/31/15 0547)     Initial Impression / Assessment and Plan / ED Course  I have reviewed the triage vital signs and the nursing notes.  Pertinent labs & imaging results that were available during my care of the patient were reviewed by me and considered in my medical decision making (see chart for details).  Clinical Course  Value Comment By Time  DG Lumbar Spine Complete No obvious fracture or dislocation. Hardware stable.  Lona Kettle, [place] 11/02 1610   Review of [place] controlled substance database shows chronic rx for clonazepam and dextroamphetamine-amphetamine ER.  Lona Kettle, [place] 11/02 9604   Patient presents to ED with complaint of low back pain. Patient is afebrile and non-toxic appearing in NAD. Vital signs are remarkable for elevated blood pressure. Physical exam remarkable for TTP of lumbar spine and b/l lumbar paravertebral muscles, L > R. No focal neuro deficits on exam. Patient is able to ambulate, but states it is painful. No loss of bowel or bladder function. No saddle anesthesia. Low suspicion for cauda equina. No fever, h/o cancer, h/o IVDU. Will check lumbar x-ray to ensure hardware intact. Will given robaxin, IM toradol, and IM steroids.  Patient became increasingly angry and agitated with treatment plan. States he's already had NSAIDs (approximately 6 hours ago) and that robaxin is ineffective, stating "no one prescribes that." Suggested flexeril, states "it makes me feel weird." Re-iterates that the only thing that works is "dilaudid and steroids." Educated patient on policy regarding narcotic medication and chronic pain. Patient continues to escalate stating, "I've wasted my time" and "you're giving me ineffective treatment (NSAIDs)." Will given 0.5mg  dilaudid in ED for acute management of pain. Lidoderm patch ordered as well.   Patient given 0.5mg  dilaudid in ED. Patient declined lidoderm patch stating he  has them and they don't work. X-ray shows hardware stable, no obvious fracture or dislocation. Patient asked nurse what muscle relaxer I was prescribing; specifically requesting soma, stating it's "the only one that works." As I was going to update patient, nurse stated patient requesting to speak with attending.   Rx PO short steroid course and lidoderm patches. Follow up with Dr. Lovell Sheehan for further evaluation and management. Return precautions provided. Patient appears safe for d/c.    Final Clinical Impressions(s) / ED Diagnoses   Final diagnoses:  Acute exacerbation of chronic low back pain    New Prescriptions Discharge Medication List as of 12/31/2015  7:39 AM    START taking these medications   Details  lidocaine (LIDODERM) 5 % Place 1 patch onto the skin daily. Remove & Discard patch within 12 hours or as directed by MD, Starting  Thu 12/31/2015, Print    predniSONE (DELTASONE) 20 MG tablet Take 2 tablets (40 mg total) by mouth daily., Starting Thu 12/31/2015, Until Mon 01/04/2016, Print         Lona Kettle, [place] 12/31/15 0718    Lona Kettle, PA-C 12/31/15 1335    Cy Blamer, MD 12/31/15 2348

## 2015-12-31 NOTE — ED Notes (Signed)
Attempted to discharge pt. Pt appears to be very upset , pt is argumentative about every discharge instruction. Pt requesting barcotica prescriptions.  Providers made aware . Provider states will not go back again  to room to talk to pt and pt is to be discharged. Charge nurse made aware and is at bedside.

## 2015-12-31 NOTE — ED Notes (Signed)
Pt ambulated from room to the bathroom and back without any help

## 2015-12-31 NOTE — ED Notes (Signed)
Pt states that Percocet makes him itch. " The only thing the previous Percocet did was make me itch"  He states he had a spinal fusion in L4 - L5  3 years ago and has not taken medication since then for pain.

## 2015-12-31 NOTE — ED Notes (Signed)
Pt refuses to leave department, walked back to nursing station. Pt is argumentative again about today's visit. Pt was asked to leave and security , [employer] officer will escort pt out .

## 2015-12-31 NOTE — ED Provider Notes (Signed)
Patient refused discharge for Arvilla MeresAshley Meyer, PA.  Patient wanted to speak with attending physician (EDP sat on stool to patient's right with an open posture).  EDP went to speak with patient with Cleora FleetJeremy Manness, tech as a chaperone for the entirety of a the 10 minute discussion.  EDP went over results of Xrays with patient. Patient then angrily asked if EDP had reviewed them.  EDP stated she had reviewed the images prior to radiology reading as well as review of the reading.  EDP stated there were no signs of infection or hardware loosening, no fractures. There were no acute findings. Patient's family member stated that was good news and was a relief. He stated the percocet was the only thing that kept him sitting in the waiting room. He then states he is allergic to percocet and it does not help. Patient then stated he had complete relief of his pain with the dilaudid.  But stated he needed this medication with soma to go home with as this "is the only thing that helps."  He stated that robaxin does not work and neither does flexeril and he did not want those medications.  EDP explained based on the CDC policy on opioids and the [employer] policy on opioids (effective 2017) that we do not write these medications.  EDP stated that the patient should follow up with pain management if they require these medications.  Patient stated patient became more agitated and aggressive stating we have "done nothing for him and what do you waste the thousand dollars coming to the ER for.  I'm still in pain?"  EDP stated that the purpose of the ED is to exclude life and limb threatening problems. Thankfully, there are no signs of infection requiring admission or hardware fracture or loosening that would need hospitalization.  Moreover, EDP politely stated that she understood the patient's frustration but reminded the patient politely that he had just stated he was pain free. He then changed his story to say that he was pain free but  would not be in several hours.  EDP stated that Morrie SheldonAshley had written RXs for discharge but the patient stated he has lots of medication at home including a "stack of lidoderm patches as high as you."  EDP suggested patient follow up with his neurosurgeon to discuss this further, he countered with the fact that injections do not help.  EDP stated I will ask Morrie Sheldonshley to make a referral to Norton BlizzardShane Hudnall on his discharge papers.  EDP stated he was discharged.   Patient is angry and manipulative and repeatedly changes his story.  EDP informed charge and AD Marcelle Overlie(Jen Hamilton) of this.   AD, Marcelle OverlieJen Hamilton,  went to see patient and patient was discharged with security.   Informed 5 minutes following escort out by security that patient was back at the nursing station stating that he had not been given the results of his Xray.  EDP had given results of Xray to patient with family member and CDW CorporationJeremy Maness, tech present.  He was escorted of property.     Cy BlamerApril Sicily Zaragoza, MD 12/31/15 [phone]

## 2015-12-31 NOTE — ED Notes (Signed)
ED Provider at bedside. 

## 2016-01-14 DIAGNOSIS — F411 Generalized anxiety disorder: Secondary | ICD-10-CM | POA: Diagnosis not present

## 2016-01-20 DIAGNOSIS — F3181 Bipolar II disorder: Secondary | ICD-10-CM | POA: Diagnosis not present

## 2016-01-25 DIAGNOSIS — F3181 Bipolar II disorder: Secondary | ICD-10-CM | POA: Diagnosis not present

## 2016-02-04 DIAGNOSIS — F3181 Bipolar II disorder: Secondary | ICD-10-CM | POA: Diagnosis not present

## 2016-02-04 DIAGNOSIS — F609 Personality disorder, unspecified: Secondary | ICD-10-CM | POA: Diagnosis not present

## 2016-02-04 DIAGNOSIS — F908 Attention-deficit hyperactivity disorder, other type: Secondary | ICD-10-CM | POA: Diagnosis not present

## 2016-02-11 DIAGNOSIS — F411 Generalized anxiety disorder: Secondary | ICD-10-CM | POA: Diagnosis not present

## 2016-02-11 DIAGNOSIS — F3181 Bipolar II disorder: Secondary | ICD-10-CM | POA: Diagnosis not present

## 2016-02-25 DIAGNOSIS — F3181 Bipolar II disorder: Secondary | ICD-10-CM | POA: Diagnosis not present

## 2016-03-11 DIAGNOSIS — F3181 Bipolar II disorder: Secondary | ICD-10-CM | POA: Diagnosis not present

## 2016-03-16 DIAGNOSIS — F3181 Bipolar II disorder: Secondary | ICD-10-CM | POA: Diagnosis not present

## 2016-03-18 DIAGNOSIS — F609 Personality disorder, unspecified: Secondary | ICD-10-CM | POA: Diagnosis not present

## 2016-03-18 DIAGNOSIS — F908 Attention-deficit hyperactivity disorder, other type: Secondary | ICD-10-CM | POA: Diagnosis not present

## 2016-03-18 DIAGNOSIS — F3181 Bipolar II disorder: Secondary | ICD-10-CM | POA: Diagnosis not present

## 2016-03-23 DIAGNOSIS — F3181 Bipolar II disorder: Secondary | ICD-10-CM | POA: Diagnosis not present

## 2016-03-29 DIAGNOSIS — J209 Acute bronchitis, unspecified: Secondary | ICD-10-CM | POA: Diagnosis not present

## 2016-04-04 DIAGNOSIS — J40 Bronchitis, not specified as acute or chronic: Secondary | ICD-10-CM | POA: Diagnosis not present

## 2016-04-04 DIAGNOSIS — S39012A Strain of muscle, fascia and tendon of lower back, initial encounter: Secondary | ICD-10-CM | POA: Diagnosis not present

## 2016-04-04 DIAGNOSIS — J209 Acute bronchitis, unspecified: Secondary | ICD-10-CM | POA: Diagnosis not present

## 2016-04-04 DIAGNOSIS — Z6830 Body mass index (BMI) 30.0-30.9, adult: Secondary | ICD-10-CM | POA: Diagnosis not present

## 2016-04-13 DIAGNOSIS — F3181 Bipolar II disorder: Secondary | ICD-10-CM | POA: Diagnosis not present

## 2016-04-19 DIAGNOSIS — F3181 Bipolar II disorder: Secondary | ICD-10-CM | POA: Diagnosis not present

## 2016-04-27 DIAGNOSIS — F3181 Bipolar II disorder: Secondary | ICD-10-CM | POA: Diagnosis not present

## 2016-05-11 DIAGNOSIS — F3181 Bipolar II disorder: Secondary | ICD-10-CM | POA: Diagnosis not present

## 2016-05-12 DIAGNOSIS — D224 Melanocytic nevi of scalp and neck: Secondary | ICD-10-CM | POA: Diagnosis not present

## 2016-05-12 DIAGNOSIS — D225 Melanocytic nevi of trunk: Secondary | ICD-10-CM | POA: Diagnosis not present

## 2016-05-12 DIAGNOSIS — L821 Other seborrheic keratosis: Secondary | ICD-10-CM | POA: Diagnosis not present

## 2016-05-12 DIAGNOSIS — L738 Other specified follicular disorders: Secondary | ICD-10-CM | POA: Diagnosis not present

## 2016-05-19 DIAGNOSIS — F609 Personality disorder, unspecified: Secondary | ICD-10-CM | POA: Diagnosis not present

## 2016-05-19 DIAGNOSIS — F3181 Bipolar II disorder: Secondary | ICD-10-CM | POA: Diagnosis not present

## 2016-05-19 DIAGNOSIS — F908 Attention-deficit hyperactivity disorder, other type: Secondary | ICD-10-CM | POA: Diagnosis not present

## 2016-05-26 DIAGNOSIS — F3181 Bipolar II disorder: Secondary | ICD-10-CM | POA: Diagnosis not present

## 2016-06-01 DIAGNOSIS — F3181 Bipolar II disorder: Secondary | ICD-10-CM | POA: Diagnosis not present

## 2016-06-16 DIAGNOSIS — F3181 Bipolar II disorder: Secondary | ICD-10-CM | POA: Diagnosis not present

## 2016-07-08 DIAGNOSIS — F3181 Bipolar II disorder: Secondary | ICD-10-CM | POA: Diagnosis not present

## 2016-07-14 DIAGNOSIS — I10 Essential (primary) hypertension: Secondary | ICD-10-CM | POA: Diagnosis not present

## 2016-07-14 DIAGNOSIS — Z0289 Encounter for other administrative examinations: Secondary | ICD-10-CM | POA: Diagnosis not present

## 2016-07-14 DIAGNOSIS — F418 Other specified anxiety disorders: Secondary | ICD-10-CM | POA: Diagnosis not present

## 2016-07-14 DIAGNOSIS — F3189 Other bipolar disorder: Secondary | ICD-10-CM | POA: Diagnosis not present

## 2016-07-14 DIAGNOSIS — F3181 Bipolar II disorder: Secondary | ICD-10-CM | POA: Diagnosis not present

## 2016-07-18 DIAGNOSIS — Z111 Encounter for screening for respiratory tuberculosis: Secondary | ICD-10-CM | POA: Diagnosis not present

## 2016-07-28 DIAGNOSIS — F3181 Bipolar II disorder: Secondary | ICD-10-CM | POA: Diagnosis not present

## 2016-10-20 DIAGNOSIS — F609 Personality disorder, unspecified: Secondary | ICD-10-CM | POA: Diagnosis not present

## 2016-10-20 DIAGNOSIS — F908 Attention-deficit hyperactivity disorder, other type: Secondary | ICD-10-CM | POA: Diagnosis not present

## 2016-10-20 DIAGNOSIS — F3181 Bipolar II disorder: Secondary | ICD-10-CM | POA: Diagnosis not present

## 2016-12-19 DIAGNOSIS — F908 Attention-deficit hyperactivity disorder, other type: Secondary | ICD-10-CM | POA: Diagnosis not present

## 2016-12-19 DIAGNOSIS — F3181 Bipolar II disorder: Secondary | ICD-10-CM | POA: Diagnosis not present

## 2016-12-19 DIAGNOSIS — F609 Personality disorder, unspecified: Secondary | ICD-10-CM | POA: Diagnosis not present

## 2017-04-04 DIAGNOSIS — R1012 Left upper quadrant pain: Secondary | ICD-10-CM | POA: Diagnosis not present

## 2017-04-04 DIAGNOSIS — K920 Hematemesis: Secondary | ICD-10-CM | POA: Diagnosis not present

## 2017-04-04 DIAGNOSIS — K219 Gastro-esophageal reflux disease without esophagitis: Secondary | ICD-10-CM | POA: Diagnosis not present

## 2017-04-18 DIAGNOSIS — F908 Attention-deficit hyperactivity disorder, other type: Secondary | ICD-10-CM | POA: Diagnosis not present

## 2017-04-18 DIAGNOSIS — F609 Personality disorder, unspecified: Secondary | ICD-10-CM | POA: Diagnosis not present

## 2017-04-18 DIAGNOSIS — F3181 Bipolar II disorder: Secondary | ICD-10-CM | POA: Diagnosis not present

## 2017-05-18 DIAGNOSIS — F908 Attention-deficit hyperactivity disorder, other type: Secondary | ICD-10-CM | POA: Diagnosis not present

## 2017-05-18 DIAGNOSIS — F3181 Bipolar II disorder: Secondary | ICD-10-CM | POA: Diagnosis not present

## 2017-05-18 DIAGNOSIS — F609 Personality disorder, unspecified: Secondary | ICD-10-CM | POA: Diagnosis not present

## 2017-06-20 DIAGNOSIS — F3181 Bipolar II disorder: Secondary | ICD-10-CM | POA: Diagnosis not present

## 2017-06-20 DIAGNOSIS — F609 Personality disorder, unspecified: Secondary | ICD-10-CM | POA: Diagnosis not present

## 2017-06-20 DIAGNOSIS — F908 Attention-deficit hyperactivity disorder, other type: Secondary | ICD-10-CM | POA: Diagnosis not present

## 2017-08-14 DIAGNOSIS — F3181 Bipolar II disorder: Secondary | ICD-10-CM | POA: Diagnosis not present

## 2017-09-05 DIAGNOSIS — G4709 Other insomnia: Secondary | ICD-10-CM | POA: Diagnosis not present

## 2017-09-05 DIAGNOSIS — R42 Dizziness and giddiness: Secondary | ICD-10-CM | POA: Diagnosis not present

## 2017-09-05 DIAGNOSIS — F319 Bipolar disorder, unspecified: Secondary | ICD-10-CM | POA: Diagnosis not present

## 2017-09-09 ENCOUNTER — Encounter (HOSPITAL_COMMUNITY): Payer: Self-pay | Admitting: Emergency Medicine

## 2017-09-09 ENCOUNTER — Emergency Department (HOSPITAL_COMMUNITY)
Admission: EM | Admit: 2017-09-09 | Discharge: 2017-09-09 | Disposition: A | Discharge status: Home / Self Care | Payer: BLUE CROSS/BLUE SHIELD | Attending: Emergency Medicine | Admitting: Emergency Medicine

## 2017-09-09 ENCOUNTER — Other Ambulatory Visit: Payer: Self-pay

## 2017-09-09 DIAGNOSIS — F17210 Nicotine dependence, cigarettes, uncomplicated: Secondary | ICD-10-CM | POA: Insufficient documentation

## 2017-09-09 DIAGNOSIS — Z79899 Other long term (current) drug therapy: Secondary | ICD-10-CM | POA: Insufficient documentation

## 2017-09-09 DIAGNOSIS — R531 Weakness: Secondary | ICD-10-CM | POA: Diagnosis not present

## 2017-09-09 DIAGNOSIS — R5383 Other fatigue: Secondary | ICD-10-CM | POA: Insufficient documentation

## 2017-09-09 DIAGNOSIS — Z7982 Long term (current) use of aspirin: Secondary | ICD-10-CM | POA: Insufficient documentation

## 2017-09-09 DIAGNOSIS — Z96642 Presence of left artificial hip joint: Secondary | ICD-10-CM | POA: Diagnosis not present

## 2017-09-09 DIAGNOSIS — F329 Major depressive disorder, single episode, unspecified: Secondary | ICD-10-CM | POA: Insufficient documentation

## 2017-09-09 DIAGNOSIS — T50905A Adverse effect of unspecified drugs, medicaments and biological substances, initial encounter: Secondary | ICD-10-CM

## 2017-09-09 DIAGNOSIS — T887XXA Unspecified adverse effect of drug or medicament, initial encounter: Secondary | ICD-10-CM | POA: Diagnosis not present

## 2017-09-09 DIAGNOSIS — F32A Depression, unspecified: Secondary | ICD-10-CM

## 2017-09-09 LAB — BASIC METABOLIC PANEL
Anion gap: 9 (ref 5–15)
BUN: 11 mg/dL (ref 6–20)
CO2: 24 mmol/L (ref 22–32)
Calcium: 9.5 mg/dL (ref 8.9–10.3)
Chloride: 106 mmol/L (ref 98–111)
Creatinine, Ser: 0.99 mg/dL (ref 0.61–1.24)
GFR calc Af Amer: >60 mL/min (ref >60)
GFR calc non Af Amer: >60 mL/min (ref >60)
Glucose, Bld: 89 mg/dL (ref 70–99)
Potassium: 3.7 mmol/L (ref 3.5–5.1)
Sodium: 139 mmol/L (ref 135–145)

## 2017-09-09 LAB — CBC
HCT: 40.4 % (ref 39.0–52.0)
Hemoglobin: 14.2 g/dL (ref 13.0–17.0)
MCH: 30.5 pg (ref 26.0–34.0)
MCHC: 35.1 g/dL (ref 30.0–36.0)
MCV: 86.7 fL (ref 78.0–100.0)
Platelets: 258 10*3/uL (ref 150–400)
RBC: 4.66 MIL/uL (ref 4.22–5.81)
RDW: 12.4 % (ref 11.5–15.5)
WBC: 7.2 10*3/uL (ref 4.0–10.5)

## 2017-09-09 LAB — LITHIUM LEVEL: Lithium Lvl: 0.12 mmol/L — ABNORMAL LOW (ref 0.60–1.20)

## 2017-09-09 NOTE — ED Provider Notes (Signed)
[facility] EMERGENCY DEPARTMENT Provider Note   CSN: [id number] Arrival date & time: 09/09/17  1936     History   Chief Complaint Chief Complaint  Patient presents with  . Abnormal Lab    HPI Shawn Romero is a 52 y.o. male.  Patient with a history of HTN, depression presents with symptoms of general malaise and fatigue, mild but persistent nausea without vomiting, intermittent mild confusion, intermittent diarrhea. He reports falling multiple times in the previous week. He is on lithium and states that 3 months ago his psychiatrist doubled his dose from 450 mg QD to 450 mg BID. He started feeling symptoms similar to symptoms this week so he reduced his dose back to 450 mg QD for one week then went back to twice daily dosing.Last week symptoms returned and were significantly worse than before. He was seen by his doctor on Tuesday of this week (5 days ago) and his lithium level was found to be 1.8. He took himself off the medication and has continued to be symptomatic throughout the week. He states his depression is significant but he denies SI/HI/AVH.    The history is provided by the patient and the spouse. No language interpreter was used.  Abnormal Lab    Past Medical History:  Diagnosis Date  . Acetabular fracture (HCC)   . Arthritis   . Depression   . GERD (gastroesophageal reflux disease)     There are no active problems to display for this patient.   Past Surgical History:  Procedure Laterality Date  . HERNIA REPAIR  87   ing lft   . JOINT REPLACEMENT     total hip left 11  . REVISION OF TOTAL HIP ACETABULUM          Home Medications    Prior to Admission medications   Medication Sig Start Date End Date Taking? Authorizing Provider  amphetamine-dextroamphetamine (ADDERALL XR) 30 MG 24 hr capsule Take 30 mg by mouth daily as needed (to focus at work).   Yes [provider]  aspirin EC 81 MG tablet Take 81 mg by mouth daily as  needed (stress (to prevent chest pain)).   Yes [provider]  busPIRone (BUSPAR) 15 MG tablet Take 15 mg by mouth 3 (three) times daily. 09/05/17  Yes [provider]  losartan (COZAAR) 50 MG tablet Take 50 mg by mouth at bedtime. 09/03/17  Yes [provider]  MAGNESIUM PO Take 1-2 tablets by mouth daily.   Yes [provider]  Multiple Vitamin (MULTIVITAMIN WITH MINERALS) TABS tablet Take 2 tablets by mouth daily. Men's One a Day chewables   Yes [provider]  naproxen (NAPROSYN) 500 MG tablet Take 500 mg by mouth 2 (two) times daily as needed (pain).  09/05/17  Yes [provider]  omeprazole (PRILOSEC) 40 MG capsule Take 40 mg by mouth at bedtime. 09/02/17  Yes [provider]  POTASSIUM PO Take 1-2 tablets by mouth daily.   Yes [provider]  propranolol (INDERAL) 10 MG tablet Take 10 mg by mouth 2 (two) times daily as needed (anxiety).  09/02/17  Yes [provider]  sildenafil (REVATIO) 20 MG tablet Take 60-100 mg by mouth daily as needed (erectile dysfunction).  08/11/17  Yes [provider]  temazepam (RESTORIL) 15 MG capsule Take 15 mg by mouth at bedtime. 09/05/17  Yes [provider]  traZODone (DESYREL) 150 MG tablet Take 300 mg by mouth at bedtime.  Yes [provider]  diazepam (VALIUM) 5 MG tablet Take 1 tablet (5 mg total) by mouth every 6 (six) hours as needed. Patient not taking: Reported on 09/09/2017 04/05/12   Tressie StalkerJenkins, Jeffrey, MD  HYDROmorphone (DILAUDID) 4 MG tablet Take 1 tablet (4 mg total) by mouth every 4 (four) hours as needed. Patient not taking: Reported on 09/09/2017 04/05/12   Tressie StalkerJenkins, Jeffrey, MD  lidocaine (LIDODERM) 5 % Place 1 patch onto the skin daily. Remove & Discard patch within 12 hours or as directed by MD Patient not taking: Reported on 09/09/2017 12/31/15   Deborha PaymentMeyer, Ashley L, PA-C  lithium carbonate (ESKALITH) 450 MG CR tablet Take 450 mg by mouth 2 (two) times  daily. 07/25/17   [provider]  lithium carbonate (LITHOBID) 300 MG CR tablet Take 300 mg by mouth 2 (two) times daily. 09/07/17   [provider]  OxyCODONE (OXYCONTIN) 40 mg T12A Take 1 tablet (40 mg total) by mouth every 12 (twelve) hours. Patient not taking: Reported on 09/09/2017 04/05/12   Tressie StalkerJenkins, Jeffrey, MD    Family History History reviewed. No pertinent family history.  Social History Social History   Tobacco Use  . Smoking status: Current Every Day Smoker    Packs/day: 1.00    Years: 10.00    Pack years: 10.00    Types: Cigarettes  . Smokeless tobacco: Never Used  Substance Use Topics  . Alcohol use: No  . Drug use: No     Allergies   Patient has no known allergies.   Review of Systems Review of Systems  Constitutional: Positive for fatigue. Negative for chills and fever.  HENT: Negative.   Respiratory: Negative.   Cardiovascular: Negative.   Gastrointestinal: Positive for diarrhea and nausea.  Musculoskeletal: Positive for myalgias.  Skin: Negative.  Negative for color change.  Neurological: Positive for weakness.       Feeling off balance  Psychiatric/Behavioral: Positive for confusion.     Physical Exam Updated Vital Signs BP (!) 157/105   Pulse 75   Temp 98.5 F (36.9 C) (Oral)   Resp 18   Ht 6\' 4"  (1.93 m)   Wt 106.6 kg (235 lb)   SpO2 94%   BMI 28.61 kg/m   Physical Exam  Constitutional: He is oriented to person, place, and time. He appears well-developed and well-nourished.  HENT:  Head: Normocephalic.  Neck: Normal range of motion.  Cardiovascular: Normal rate and regular rhythm.  Pulmonary/Chest: Effort normal.  Musculoskeletal: Normal range of motion.  Neurological: He is alert and oriented to person, place, and time.  Skin: Skin is warm and dry. No rash noted.  Psychiatric: His speech is normal and behavior is normal. He is not actively hallucinating. He exhibits a depressed mood. He expresses no homicidal and  no suicidal ideation.     ED Treatments / Results  Labs (all labs ordered are listed, but only abnormal results are displayed) Labs Reviewed  LITHIUM LEVEL - Abnormal; Notable for the following components:      Result Value   Lithium Lvl 0.12 (*)    All other components within normal limits  CBC  BASIC METABOLIC PANEL    EKG None  Radiology No results found.  Procedures Procedures (including critical care time)  Medications Ordered in ED Medications - No data to display   Initial Impression / Assessment and Plan / ED Course  I have reviewed the triage vital signs and the nursing notes.  Pertinent labs & imaging results that  were available during my care of the patient were reviewed by me and considered in my medical decision making (see chart for details).     Patient here with vague symptoms as described in HPI after being lithium toxic earlier in the week and abrupt discontinuation of the medication 5 days ago.  Discussed lithium with pharmacy. 1/2 life is expected to be 24-36 hours. Lithium level today is .12, low. Doubt he is experiencing lithium toxicity but could still be symptomatic from abrupt discontinuation. Depression could also be lending to symptoms. Not suicidal, homicidal, hallucinating. He feels safe being at home.  Will restart Lithium at 450 mg QD. He is strongly encouraged to see his psychiatrist next week for further medication management.   Final Clinical Impressions(s) / ED Diagnoses   Final diagnoses:  None   1. Adverse drug reaction 2. Depression  ED Discharge Orders    None       Danne Harbor 09/09/17 2320    Gerhard Munch, MD 09/09/17 [phone]

## 2017-09-09 NOTE — ED Triage Notes (Addendum)
Patient from home, his lithium level was checked Tuesday and it "pegged at 1.8" and read as high without knowing the exact level. He discontinued the medication 3 days ago patient has been drinking gatorade and taking magnesium and potassium. Patient c/o of tremors, malaise, mood swings, and "executive function issues" per wife has some confusion and has long pauses when responding. Wife reports patient "jumped out of the car" a few days ago and he's "not acting like himself."  Denies SI, AOx4 in triage, no acute distress noted.

## 2017-09-09 NOTE — Discharge Instructions (Addendum)
Restart your lithium at 450 mg daily. It is extremely important to see your psychiatrist next week for further medication management. Return to the emergency department with any new or concerning symptoms.

## 2017-10-17 DIAGNOSIS — F319 Bipolar disorder, unspecified: Secondary | ICD-10-CM | POA: Diagnosis not present

## 2017-10-17 DIAGNOSIS — Z125 Encounter for screening for malignant neoplasm of prostate: Secondary | ICD-10-CM | POA: Diagnosis not present

## 2017-10-17 DIAGNOSIS — I10 Essential (primary) hypertension: Secondary | ICD-10-CM | POA: Diagnosis not present

## 2017-10-17 DIAGNOSIS — R82998 Other abnormal findings in urine: Secondary | ICD-10-CM | POA: Diagnosis not present

## 2017-10-17 DIAGNOSIS — Z0000 Encounter for general adult medical examination without abnormal findings: Secondary | ICD-10-CM | POA: Diagnosis not present

## 2017-10-18 DIAGNOSIS — Z1389 Encounter for screening for other disorder: Secondary | ICD-10-CM | POA: Diagnosis not present

## 2017-10-18 DIAGNOSIS — F3181 Bipolar II disorder: Secondary | ICD-10-CM | POA: Diagnosis not present

## 2017-10-18 DIAGNOSIS — K219 Gastro-esophageal reflux disease without esophagitis: Secondary | ICD-10-CM | POA: Diagnosis not present

## 2017-10-18 DIAGNOSIS — F609 Personality disorder, unspecified: Secondary | ICD-10-CM | POA: Diagnosis not present

## 2017-10-18 DIAGNOSIS — F908 Attention-deficit hyperactivity disorder, other type: Secondary | ICD-10-CM | POA: Diagnosis not present

## 2017-10-18 DIAGNOSIS — G4709 Other insomnia: Secondary | ICD-10-CM | POA: Diagnosis not present

## 2017-10-18 DIAGNOSIS — Z0000 Encounter for general adult medical examination without abnormal findings: Secondary | ICD-10-CM | POA: Diagnosis not present

## 2017-10-18 DIAGNOSIS — I10 Essential (primary) hypertension: Secondary | ICD-10-CM | POA: Diagnosis not present

## 2017-10-23 ENCOUNTER — Encounter: Payer: Self-pay | Admitting: Gastroenterology

## 2017-11-06 DIAGNOSIS — F3181 Bipolar II disorder: Secondary | ICD-10-CM | POA: Diagnosis not present

## 2017-11-14 DIAGNOSIS — F3181 Bipolar II disorder: Secondary | ICD-10-CM | POA: Diagnosis not present

## 2017-11-29 ENCOUNTER — Telehealth: Payer: Self-pay

## 2017-11-29 NOTE — Telephone Encounter (Signed)
Left message for patient to call by 5 pm today to reschedule his previsit. He did not show for his 2 pm appointment today.

## 2017-11-30 ENCOUNTER — Ambulatory Visit: Payer: BLUE CROSS/BLUE SHIELD | Admitting: Psychiatry

## 2017-11-30 DIAGNOSIS — F3181 Bipolar II disorder: Secondary | ICD-10-CM

## 2017-11-30 NOTE — Progress Notes (Signed)
      [facility] Counselor/Therapist Progress Note   Patient ID: Shawn Romero, MRN: [id number]  Date: 11/30/2017  Timespent: 45 minutes  Treatment Type: Individual  Subjective: The client reports that he is significantly better.  "I am sober."  He has returned to regular meetings at AA 3-4 times per week. He reports in the last week that he had gone to [facility] to work.  He ended up taking a whole bottle of benzodiazepines in his hotel room.  This was his motivation for getting sober once again.  He also reports that things are repairing with his relationship with his wife.  Client identifies his major issue as fear.  "It is my selfishness."  "I need to quit competing.  "We discussed him considering others before himself and trying to live in the moment.  If he can take care of the present tense and turn things over to the care of God.  The client quoted a prayer from AA "take away my fear and help me to concentrate on what you would have me to be"  I used EMDR with the client to reduce his anxiety from a SUDS equals 7 to less than 2 at the end of the session.  His positive cognition upon leaving was "I have hope."  Interventions:Solution Focused, Psychosocial Skills: Mindfulness, Reframing and Other: EMDR  Mental Status Exam:   Appearance:   Casual     Behavior:  Appropriate  Motor:  Normal  Speech/Language:   Clear and Coherent  Affect:  Appropriate  Mood:  anxious  Thought process:  Coherent  Thought content:    Logical  Perceptual disturbances:    Normal  Orientation:  Full (Time, Place, and Person)  Attention:  Good  Concentration:  good  Memory:  Immediate  Fund of knowledge:   Good  Insight:    Good  Judgment:   Good  Impulse Control:  good    Reported Symptoms: Anxiety   Risk Assessment: Danger to Self:  No Self-injurious Behavior: No Danger to Others: No Duty to Warn:no Physical Aggression / Violence:No  Access to Firearms a concern: No  Gang  Involvement:No   Diagnosis:   ICD-10-CM   1. Bipolar II disorder [facility]) F31.81      Plan: Mindfulness, +self talk  Tammi Sou, [facility]

## 2017-12-07 ENCOUNTER — Ambulatory Visit: Payer: BLUE CROSS/BLUE SHIELD | Admitting: Psychiatry

## 2017-12-07 DIAGNOSIS — F3181 Bipolar II disorder: Secondary | ICD-10-CM | POA: Diagnosis not present
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
## 2017-12-13 ENCOUNTER — Encounter: Payer: BLUE CROSS/BLUE SHIELD | Admitting: Gastroenterology

## 2017-12-13 ENCOUNTER — Ambulatory Visit: Payer: BLUE CROSS/BLUE SHIELD | Admitting: Psychiatry

## 2017-12-13 DIAGNOSIS — F3181 Bipolar II disorder: Secondary | ICD-10-CM | POA: Diagnosis not present
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
## 2017-12-21 DIAGNOSIS — F609 Personality disorder, unspecified: Secondary | ICD-10-CM | POA: Diagnosis not present

## 2017-12-21 DIAGNOSIS — F3181 Bipolar II disorder: Secondary | ICD-10-CM | POA: Diagnosis not present

## 2017-12-21 DIAGNOSIS — F908 Attention-deficit hyperactivity disorder, other type: Secondary | ICD-10-CM | POA: Diagnosis not present

## 2017-12-27 ENCOUNTER — Ambulatory Visit (INDEPENDENT_AMBULATORY_CARE_PROVIDER_SITE_OTHER): Payer: BLUE CROSS/BLUE SHIELD | Admitting: Psychiatry

## 2017-12-27 DIAGNOSIS — F3181 Bipolar II disorder: Secondary | ICD-10-CM | POA: Diagnosis not present

## 2017-12-27 IMAGING — CR DG LUMBAR SPINE COMPLETE 4+V
5 series · 5 of 5 positions shown · non-contrast
Comparison: 12/19/2014 lumbar radiographs

CLINICAL DATA: 50 y/o M; history of lumbar surgery felt pop in the
lower back with pain.

EXAM:
LUMBAR SPINE - COMPLETE 4+ VIEW

[t lumbar spine ap]
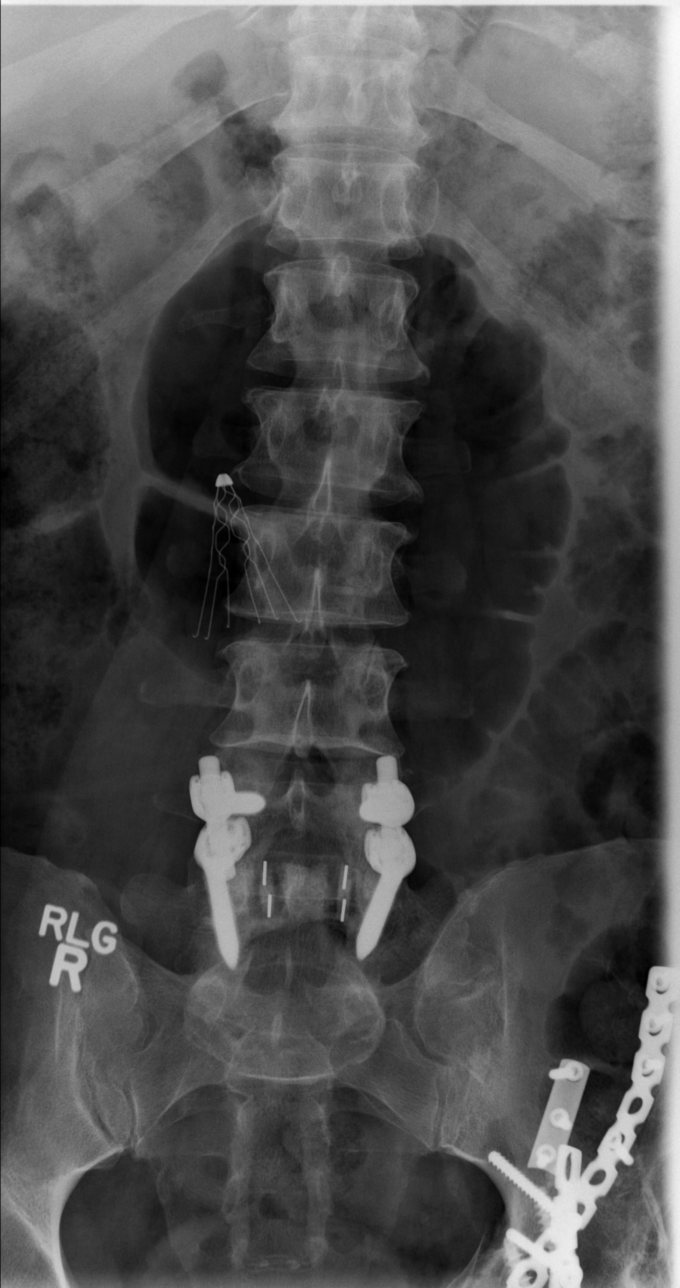

[t lumbar spine obl (1 of 2)]
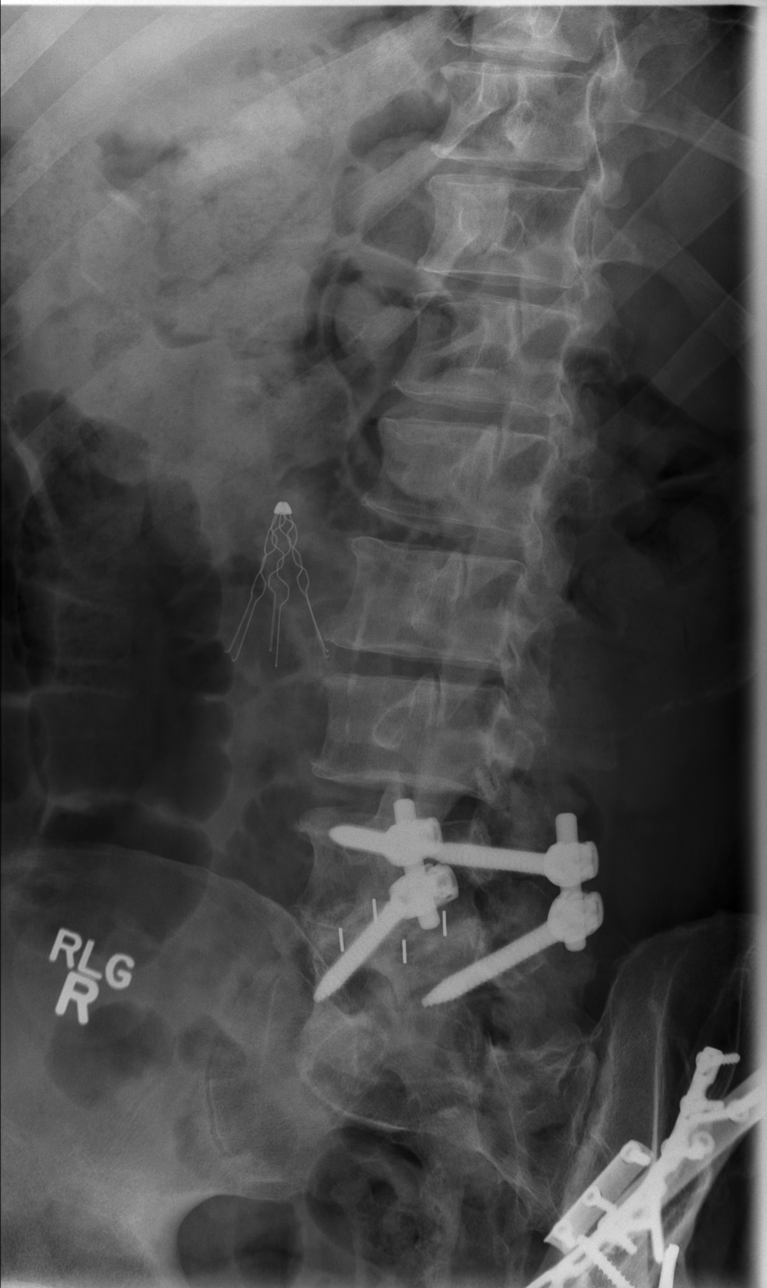

[t lumbar spine obl (2 of 2)]
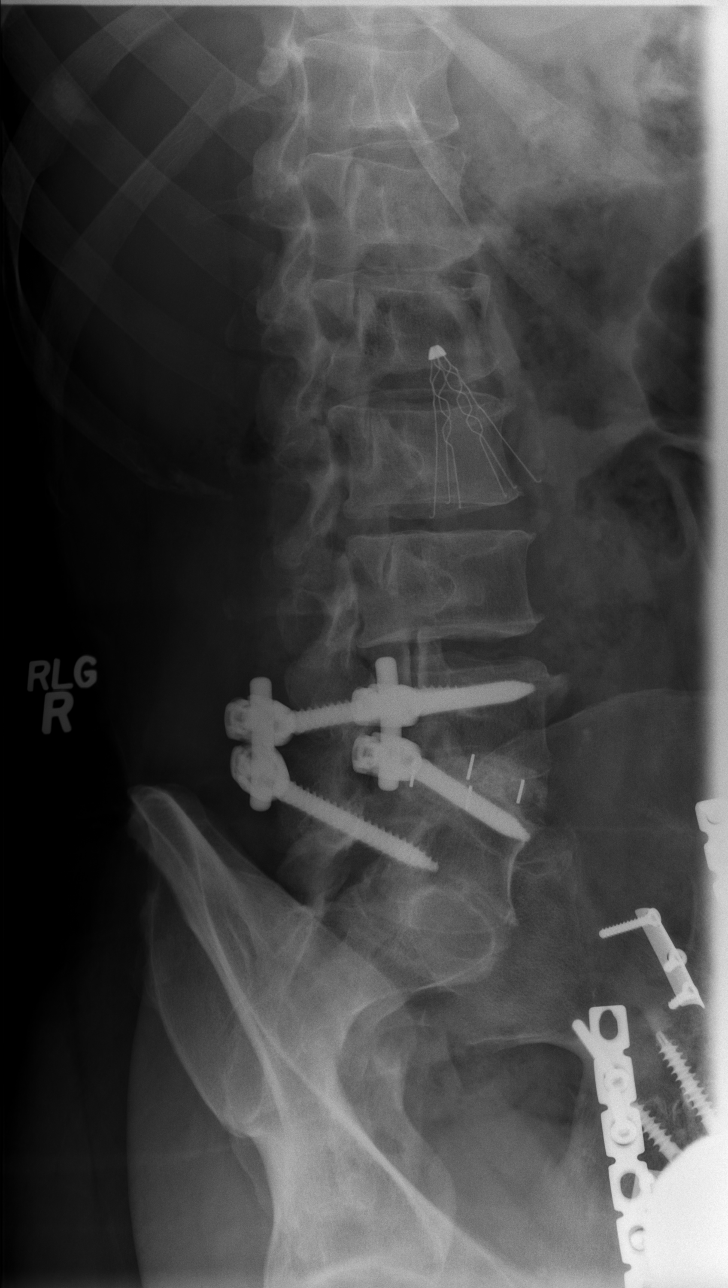

[t lumbar spine lat]
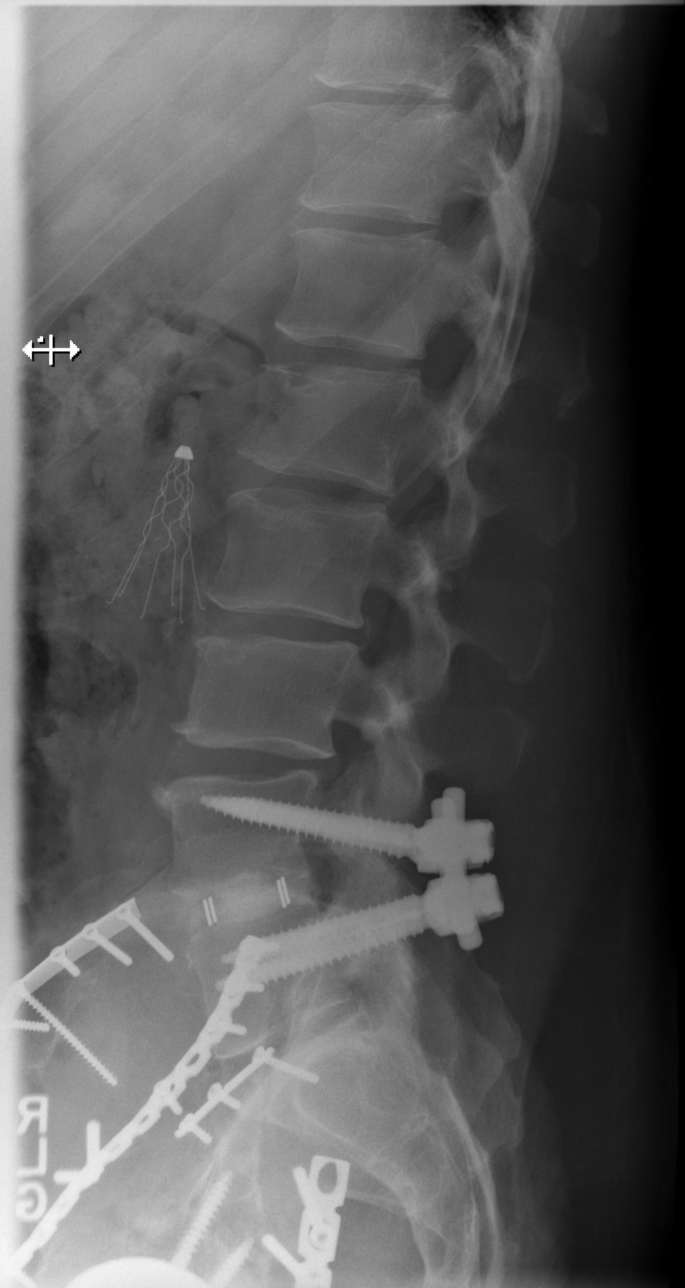

[t lumbar l-5 s-1 spot]
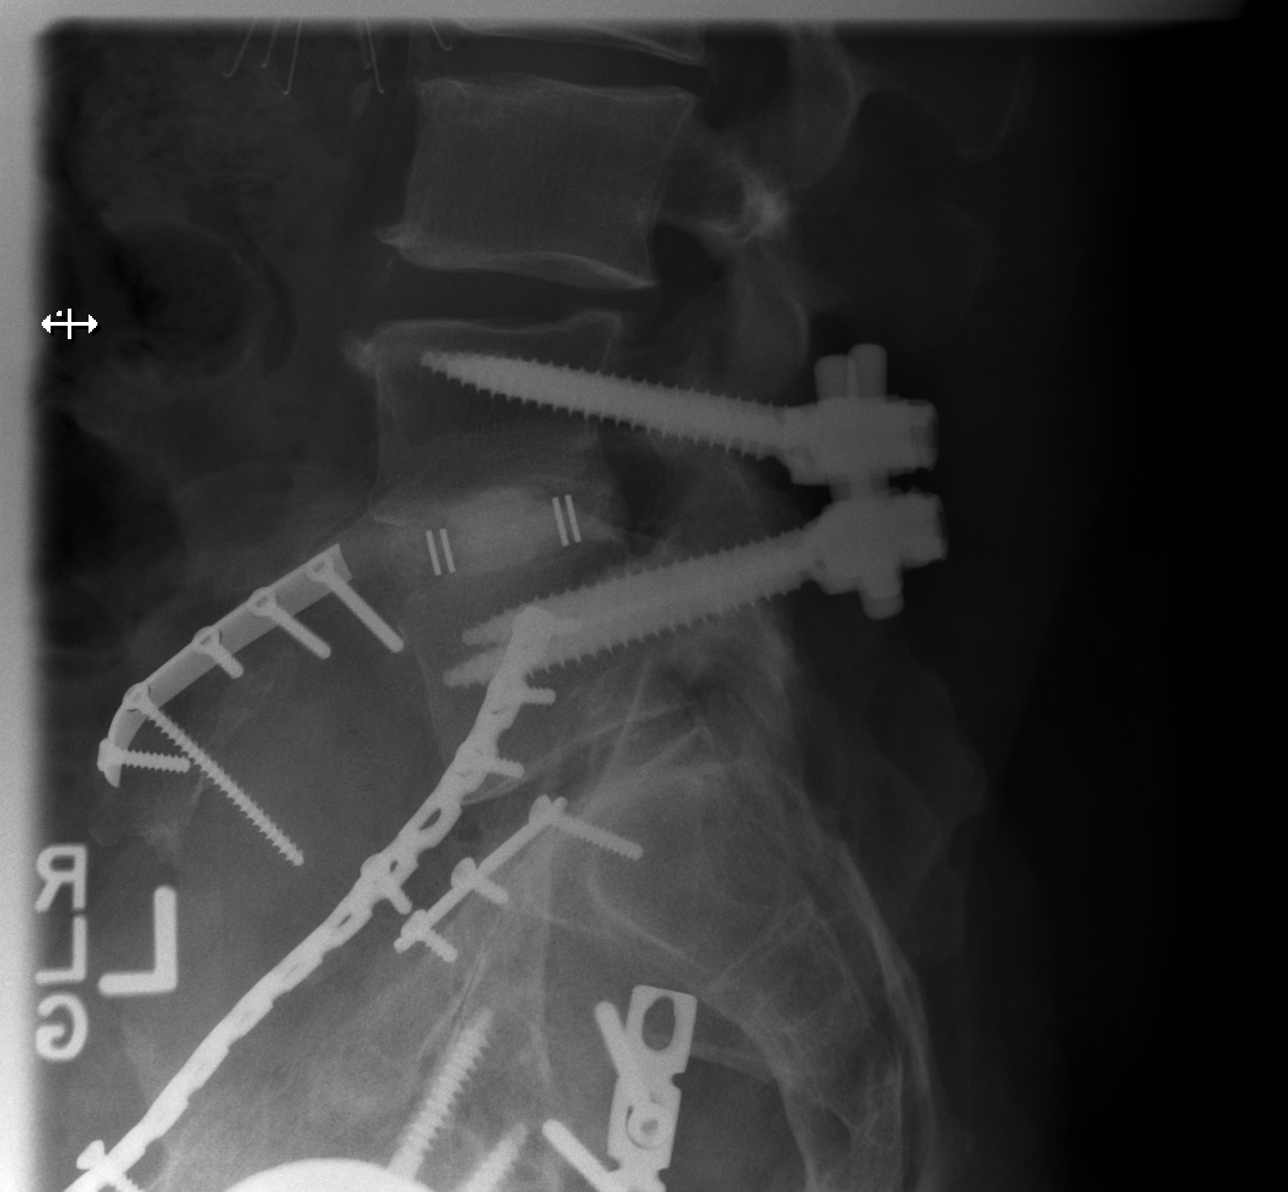

[5 of 5 positions shown; findings below may reference images not displayed]

FINDINGS: Five lumbar type non-rib-bearing vertebral bodies. Small T12 ribs.
L4-5 posterior instrumented fusion, laminectomy, and interbody
fusion with intact hardware and without apparent hardware related
complication. L3-4 stable minimal retrolisthesis. Lumbar lordosis is
maintained. Vertebral body heights are preserved. Discogenic
degenerative changes with mild disc space narrowing and small
marginal osteophytes greatest at the L3-4 and L5-S1 levels.

No acute fracture identified. Partially visualized are multiple
plates and screws fixing pelvic fractures. IVC filter.
IMPRESSION: No acute fracture or dislocation is identified. Stable degenerative
changes of the lumbar spine, posterior L4-5 instrumented and
interbody fusion, and partially visualized left pelvic fixed chronic
fractures.

By: Marisa Santos Bruns M.D.

## 2017-12-27 NOTE — Progress Notes (Signed)
      [facility] Counselor/Therapist Progress Note   Patient ID: Shawn Romero, MRN: [id number]  Date: 12/27/2017  Timespent: 58 minutes   Treatment Type: Individual   Reported Symptoms: Obsessive thinking, Fatigue and anxiety   Mental Status Exam:    Appearance:   Casual     Behavior:  Appropriate  Motor:  Normal  Speech/Language:   Clear and Coherent  Affect:  Appropriate  Mood:  anxious  Thought process:  normal  Thought content:    WNL  Sensory/Perceptual disturbances:    WNL  Orientation:  oriented to person, place, time/date and situation  Attention:  Good  Concentration:  Good  Memory:  WNL  Fund of knowledge:   Good  Insight:    Good  Judgment:   Good  Impulse Control:  Good     Risk Assessment: Danger to Self:  No Self-injurious Behavior: No Danger to Others: No Duty to Warn:no Physical Aggression / Violence:No  Access to Firearms a concern: No  Gang Involvement:No    Subjective: The client states today, "I am miserable."  He states that his house is in foreclosure, he has $300 in the bank, and $6000 worth of bills.  His fear is that his family will be homeless because they have no other options.  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  They have changed the format of how things are done and they have made much fewer sales.  This is of great concern to the client. Client's anxiety was up higher, his negative beliefs were "no one likes me or cares about me."  I used eye movement desensitization and reprocessing with the client to focus on the reduction of his anxiety.  His subjective units of distress went from an 8 to less than 3 at the end of the session. We discussed at length how the client has been inconsistent with his work starting and stopping different jobs.  He agrees.  We discussed the need for more discipline.  I explained it in terms of mood independent behavior.  This made a lot of sense to the client  and he will try to implement that. I also explained to the client that there are many social service agencies in [place] that Marqueta Pulley be able to help him with his house and bills.   Interventions: Cognitive Behavioral Therapy, Solution-Oriented/Positive Psychology, Eye Movement Desensitization and Reprocessing (EMDR) and Insight-Oriented   Diagnosis:   ICD-10-CM   1. Bipolar II disorder (HCC) F31.81      Plan: Social service agencies, mood independent behavior.   Gelene Mink Sequoyah Counterman, [place]

## 2018-01-04 ENCOUNTER — Ambulatory Visit (INDEPENDENT_AMBULATORY_CARE_PROVIDER_SITE_OTHER): Payer: BLUE CROSS/BLUE SHIELD | Admitting: Psychiatry

## 2018-01-04 DIAGNOSIS — F3181 Bipolar II disorder: Secondary | ICD-10-CM | POA: Diagnosis not present

## 2018-01-04 NOTE — Progress Notes (Signed)
      [facility] Counselor/Therapist Progress Note   Patient ID: Shawn Romero, MRN: [id number]  Date: 01/04/2018  Timespent: 59 minutes   Treatment Type: Individual   Reported Symptoms: Fatigue   Mental Status Exam:    Appearance:   Casual     Behavior:  Appropriate  Motor:  Normal  Speech/Language:   Clear and Coherent  Affect:  Appropriate  Mood:  anxious and labile  Thought process:  normal  Thought content:    WNL  Sensory/Perceptual disturbances:    WNL  Orientation:  oriented to person, place, time/date and situation  Attention:  Good  Concentration:  Good  Memory:  WNL  Fund of knowledge:   Good  Insight:    Good  Judgment:   Good  Impulse Control:  Good     Risk Assessment: Danger to Self:  No Self-injurious Behavior: No Danger to Others: No Duty to Warn:no Physical Aggression / Violence:No  Access to Firearms a concern: No  Gang Involvement:No    Subjective: The client reports that he had called a friend who is taking real estate classes.  That friend put the client in touch with the man who wanted to buy their house.  He states, "I was not expecting this."  He told the man that he would need $20,000 over the agreed on price.  The client stated that the man was only going to give him $10,000.  They negotiated back-and-forth and settled on 15,000.  Then his boss put him in touch with the company in [place] who came and evaluated his house.  They ended up giving him $30,000 on top of what he needed.  He stated, I know this is a God thing." He said earlier in the week he had gone to meditation class at [facility].  During the meditation they were focusing on the prayer of [facility].  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  I told him that I agreed with God's assessment that he needs to write the  book and that being client was a plus as well. The client will focus on the book.  His mood was much more positive and he felt more encouraged.   Interventions: Solution-Oriented/Positive Psychology, Eye Movement Desensitization and Reprocessing (EMDR) and Insight-Oriented   Diagnosis:   ICD-10-CM   1. Bipolar II disorder (HCC) F31.81      Plan: Write, meditate, pray.   Shawn Romero, [place]

## 2018-01-10 ENCOUNTER — Ambulatory Visit: Payer: BLUE CROSS/BLUE SHIELD | Admitting: Psychiatry

## 2018-01-10 DIAGNOSIS — F3181 Bipolar II disorder: Secondary | ICD-10-CM | POA: Diagnosis not present

## 2018-01-10 NOTE — Progress Notes (Signed)
      [facility] Counselor/Therapist Progress Note   Patient ID: Shawn RochesterStuart Romero, MRN: [id number]  Date: 01/10/2018  Timespent: 58 minutes   Treatment Type: Individual   Reported Symptoms: Obsessive thinking   Mental Status Exam:    Appearance:   Casual     Behavior:  Appropriate  Motor:  Normal  Speech/Language:   Clear and Coherent  Affect:  Appropriate  Mood:  anxious and labile  Thought process:  normal  Thought content:    WNL  Sensory/Perceptual disturbances:    WNL  Orientation:  oriented to person, place, time/date and situation  Attention:  Good  Concentration:  Good  Memory:  WNL  Fund of knowledge:   Good  Insight:    Good  Judgment:   Good  Impulse Control:  Good     Risk Assessment: Danger to Self:  No Self-injurious Behavior: No Danger to Others: No Duty to Warn:no Physical Aggression / Violence:No  Access to Firearms a concern: No  Gang Involvement:No    Subjective: The client states that he and his wife are looking for new housing.  They are going to close on their current house by this Friday.  He says the biggest problem he has had has been his credit score. He states he and his wife continue to argue chronically.  Today he was up and down with his mood feeling anxious some anger and then sad.  We focused on reducing the clients level of agitation from a subjective units of distress at 7+ to less than 2 at the end of the session.  Using eye movement we help the client shift his thinking from "I cannot trust anyone" to "the people I put around me are trustworthy."  With that realization the clients agitation decreased.  The client will focus on meeting with, positive self talk and some exercise.   Interventions: Cognitive Behavioral Therapy, Solution-Oriented/Positive Psychology, Eye Movement Desensitization and Reprocessing (EMDR) and Insight-Oriented   Diagnosis:   ICD-10-CM   1. Bipolar II disorder (HCC) F31.81      Plan: Exercise, AA,  positive self talk.   Gelene MinkFrederick Kristene [place]ati, [place]

## 2018-01-18 ENCOUNTER — Ambulatory Visit: Payer: BLUE CROSS/BLUE SHIELD | Admitting: Psychiatry

## 2018-01-18 DIAGNOSIS — F908 Attention-deficit hyperactivity disorder, other type: Secondary | ICD-10-CM | POA: Diagnosis not present

## 2018-01-18 DIAGNOSIS — F609 Personality disorder, unspecified: Secondary | ICD-10-CM | POA: Diagnosis not present

## 2018-01-18 DIAGNOSIS — F3181 Bipolar II disorder: Secondary | ICD-10-CM | POA: Diagnosis not present

## 2018-03-15 ENCOUNTER — Ambulatory Visit: Payer: BLUE CROSS/BLUE SHIELD | Admitting: Psychiatry

## 2018-03-19 DIAGNOSIS — F908 Attention-deficit hyperactivity disorder, other type: Secondary | ICD-10-CM | POA: Diagnosis not present

## 2018-03-19 DIAGNOSIS — F3181 Bipolar II disorder: Secondary | ICD-10-CM | POA: Diagnosis not present

## 2018-03-19 DIAGNOSIS — F609 Personality disorder, unspecified: Secondary | ICD-10-CM | POA: Diagnosis not present

## 2018-03-21 ENCOUNTER — Ambulatory Visit: Payer: BLUE CROSS/BLUE SHIELD | Admitting: Psychiatry

## 2018-03-28 ENCOUNTER — Ambulatory Visit: Payer: BLUE CROSS/BLUE SHIELD | Admitting: Psychiatry

## 2018-04-06 ENCOUNTER — Ambulatory Visit: Payer: BLUE CROSS/BLUE SHIELD | Admitting: Psychiatry

## 2018-04-11 ENCOUNTER — Ambulatory Visit: Payer: BLUE CROSS/BLUE SHIELD | Admitting: Psychiatry

## 2018-04-18 ENCOUNTER — Ambulatory Visit: Payer: BLUE CROSS/BLUE SHIELD | Admitting: Psychiatry

## 2018-04-19 DIAGNOSIS — F609 Personality disorder, unspecified: Secondary | ICD-10-CM | POA: Diagnosis not present

## 2018-04-19 DIAGNOSIS — F908 Attention-deficit hyperactivity disorder, other type: Secondary | ICD-10-CM | POA: Diagnosis not present

## 2018-04-19 DIAGNOSIS — F3181 Bipolar II disorder: Secondary | ICD-10-CM | POA: Diagnosis not present

## 2018-04-25 ENCOUNTER — Ambulatory Visit: Payer: BLUE CROSS/BLUE SHIELD | Admitting: Psychiatry

## 2018-05-02 ENCOUNTER — Ambulatory Visit: Payer: BLUE CROSS/BLUE SHIELD | Admitting: Psychiatry

## 2018-05-09 ENCOUNTER — Ambulatory Visit: Payer: BLUE CROSS/BLUE SHIELD | Admitting: Psychiatry

## 2018-05-10 DIAGNOSIS — R110 Nausea: Secondary | ICD-10-CM | POA: Diagnosis not present

## 2018-05-10 DIAGNOSIS — I10 Essential (primary) hypertension: Secondary | ICD-10-CM | POA: Diagnosis not present

## 2018-05-10 DIAGNOSIS — Z79899 Other long term (current) drug therapy: Secondary | ICD-10-CM | POA: Diagnosis not present

## 2018-05-10 DIAGNOSIS — F319 Bipolar disorder, unspecified: Secondary | ICD-10-CM | POA: Diagnosis not present

## 2018-05-16 ENCOUNTER — Ambulatory Visit: Payer: BLUE CROSS/BLUE SHIELD | Admitting: Psychiatry

## 2018-05-23 ENCOUNTER — Ambulatory Visit: Payer: BLUE CROSS/BLUE SHIELD | Admitting: Psychiatry

## 2018-12-04 DIAGNOSIS — K219 Gastro-esophageal reflux disease without esophagitis: Secondary | ICD-10-CM | POA: Diagnosis not present

## 2018-12-04 DIAGNOSIS — I10 Essential (primary) hypertension: Secondary | ICD-10-CM | POA: Diagnosis not present

## 2018-12-04 DIAGNOSIS — F3181 Bipolar II disorder: Secondary | ICD-10-CM | POA: Diagnosis not present

## 2018-12-04 DIAGNOSIS — N529 Male erectile dysfunction, unspecified: Secondary | ICD-10-CM | POA: Diagnosis not present

## 2018-12-18 DIAGNOSIS — R0989 Other specified symptoms and signs involving the circulatory and respiratory systems: Secondary | ICD-10-CM | POA: Diagnosis not present

## 2018-12-18 DIAGNOSIS — Z20828 Contact with and (suspected) exposure to other viral communicable diseases: Secondary | ICD-10-CM | POA: Diagnosis not present

## 2018-12-18 DIAGNOSIS — R519 Headache, unspecified: Secondary | ICD-10-CM | POA: Diagnosis not present

## 2018-12-18 DIAGNOSIS — R0789 Other chest pain: Secondary | ICD-10-CM | POA: Diagnosis not present

## 2018-12-18 DIAGNOSIS — R5383 Other fatigue: Secondary | ICD-10-CM | POA: Diagnosis not present

## 2018-12-18 DIAGNOSIS — R05 Cough: Secondary | ICD-10-CM | POA: Diagnosis not present

## 2018-12-18 DIAGNOSIS — R52 Pain, unspecified: Secondary | ICD-10-CM | POA: Diagnosis not present

## 2019-01-01 DIAGNOSIS — Z20828 Contact with and (suspected) exposure to other viral communicable diseases: Secondary | ICD-10-CM | POA: Diagnosis not present

## 2019-01-01 DIAGNOSIS — R0981 Nasal congestion: Secondary | ICD-10-CM | POA: Diagnosis not present

## 2019-01-01 DIAGNOSIS — R05 Cough: Secondary | ICD-10-CM | POA: Diagnosis not present

## 2019-01-01 DIAGNOSIS — R0989 Other specified symptoms and signs involving the circulatory and respiratory systems: Secondary | ICD-10-CM | POA: Diagnosis not present

## 2019-01-01 DIAGNOSIS — J069 Acute upper respiratory infection, unspecified: Secondary | ICD-10-CM | POA: Diagnosis not present

## 2019-01-22 DIAGNOSIS — F5101 Primary insomnia: Secondary | ICD-10-CM | POA: Diagnosis not present

## 2019-01-22 DIAGNOSIS — I10 Essential (primary) hypertension: Secondary | ICD-10-CM | POA: Diagnosis not present
